# Patient Record
Sex: Female | Born: 1935 | Race: White | Hispanic: No | Smoking: Former smoker
Health system: Southern US, Community
[De-identification: ages and names within clinical notes are randomized; demographics above are authoritative.]

## PROBLEM LIST (undated history)

## (undated) DIAGNOSIS — K219 Gastro-esophageal reflux disease without esophagitis: Secondary | ICD-10-CM

## (undated) DIAGNOSIS — N3281 Overactive bladder: Secondary | ICD-10-CM

## (undated) DIAGNOSIS — E785 Hyperlipidemia, unspecified: Secondary | ICD-10-CM

## (undated) DIAGNOSIS — E119 Type 2 diabetes mellitus without complications: Secondary | ICD-10-CM

## (undated) DIAGNOSIS — F0281 Dementia in other diseases classified elsewhere with behavioral disturbance: Secondary | ICD-10-CM

## (undated) DIAGNOSIS — F02818 Dementia in other diseases classified elsewhere, unspecified severity, with other behavioral disturbance: Secondary | ICD-10-CM

## (undated) DIAGNOSIS — G309 Alzheimer's disease, unspecified: Secondary | ICD-10-CM

## (undated) DIAGNOSIS — M75100 Unspecified rotator cuff tear or rupture of unspecified shoulder, not specified as traumatic: Secondary | ICD-10-CM

## (undated) HISTORY — DX: Type 2 diabetes mellitus without complications: E11.9

## (undated) HISTORY — DX: Unspecified rotator cuff tear or rupture of unspecified shoulder, not specified as traumatic: M75.100

## (undated) HISTORY — DX: Dementia in other diseases classified elsewhere with behavioral disturbance: F02.81

## (undated) HISTORY — DX: Gastro-esophageal reflux disease without esophagitis: K21.9

## (undated) HISTORY — DX: Dementia in other diseases classified elsewhere, unspecified severity, with other behavioral disturbance: F02.818

## (undated) HISTORY — DX: Dementia in other diseases classified elsewhere with behavioral disturbance: G30.9

## (undated) HISTORY — DX: Hyperlipidemia, unspecified: E78.5

## (undated) HISTORY — DX: Overactive bladder: N32.81

---

## 1999-04-12 ENCOUNTER — Other Ambulatory Visit: Admission: RE | Admit: 1999-04-12 | Discharge: 1999-04-12 | Payer: Self-pay | Admitting: Obstetrics and Gynecology

## 2000-07-23 ENCOUNTER — Other Ambulatory Visit: Admission: RE | Admit: 2000-07-23 | Discharge: 2000-07-23 | Payer: Self-pay | Admitting: Obstetrics and Gynecology

## 2001-08-17 ENCOUNTER — Other Ambulatory Visit: Admission: RE | Admit: 2001-08-17 | Discharge: 2001-08-17 | Payer: Self-pay | Admitting: Obstetrics and Gynecology

## 2001-08-18 DIAGNOSIS — M75100 Unspecified rotator cuff tear or rupture of unspecified shoulder, not specified as traumatic: Secondary | ICD-10-CM

## 2001-08-18 HISTORY — DX: Unspecified rotator cuff tear or rupture of unspecified shoulder, not specified as traumatic: M75.100

## 2001-09-02 ENCOUNTER — Ambulatory Visit (HOSPITAL_COMMUNITY): Admission: RE | Admit: 2001-09-02 | Discharge: 2001-09-02 | Payer: Self-pay | Admitting: Gastroenterology

## 2002-06-22 ENCOUNTER — Ambulatory Visit (HOSPITAL_BASED_OUTPATIENT_CLINIC_OR_DEPARTMENT_OTHER): Admission: RE | Admit: 2002-06-22 | Discharge: 2002-06-23 | Payer: Self-pay | Admitting: Orthopedic Surgery

## 2006-07-22 ENCOUNTER — Ambulatory Visit (HOSPITAL_BASED_OUTPATIENT_CLINIC_OR_DEPARTMENT_OTHER): Admission: RE | Admit: 2006-07-22 | Discharge: 2006-07-22 | Payer: Self-pay | Admitting: Orthopedic Surgery

## 2009-09-15 ENCOUNTER — Inpatient Hospital Stay (HOSPITAL_COMMUNITY): Admission: EM | Admit: 2009-09-15 | Discharge: 2009-09-16 | Payer: Self-pay | Admitting: Emergency Medicine

## 2010-11-03 LAB — CBC
HCT: 39.5 % (ref 36.0–46.0)
Hemoglobin: 13.6 g/dL (ref 12.0–15.0)
MCHC: 34.3 g/dL (ref 30.0–36.0)
MCV: 90.2 fL (ref 78.0–100.0)
RDW: 13.1 % (ref 11.5–15.5)

## 2010-11-03 LAB — POCT I-STAT, CHEM 8
BUN: 18 mg/dL (ref 6–23)
Creatinine, Ser: 0.8 mg/dL (ref 0.4–1.2)
Glucose, Bld: 130 mg/dL — ABNORMAL HIGH (ref 70–99)
HCT: 37 % (ref 36.0–46.0)

## 2010-11-03 LAB — POCT CARDIAC MARKERS
CKMB, poc: <1 ng/mL — ABNORMAL LOW (ref 1.0–8.0)
Troponin i, poc: <0.05 ng/mL (ref 0.00–0.09)

## 2010-11-03 LAB — DIFFERENTIAL
Basophils Absolute: 0 10*3/uL (ref 0.0–0.1)
Basophils Relative: 0 % (ref 0–1)
Eosinophils Absolute: 0 10*3/uL (ref 0.0–0.7)
Lymphocytes Relative: 11 % — ABNORMAL LOW (ref 12–46)
Lymphs Abs: 0.8 10*3/uL (ref 0.7–4.0)
Monocytes Absolute: 0.3 10*3/uL (ref 0.1–1.0)
Neutrophils Relative %: 85 % — ABNORMAL HIGH (ref 43–77)

## 2010-11-03 LAB — BASIC METABOLIC PANEL
Chloride: 108 mEq/L (ref 96–112)
GFR calc Af Amer: >60 mL/min (ref >60)
GFR calc non Af Amer: >60 mL/min (ref >60)
Glucose, Bld: 109 mg/dL — ABNORMAL HIGH (ref 70–99)

## 2010-11-03 LAB — URINALYSIS, ROUTINE W REFLEX MICROSCOPIC
Glucose, UA: NEGATIVE mg/dL
Hgb urine dipstick: NEGATIVE
Ketones, ur: 15 mg/dL — AB
Specific Gravity, Urine: 1.019 (ref 1.005–1.030)

## 2010-11-03 LAB — GLUCOSE, CAPILLARY: Glucose-Capillary: 119 mg/dL — ABNORMAL HIGH (ref 70–99)

## 2010-11-27 IMAGING — CT CT HEAD W/O CM
1 of 2 series · 15 of 30 positions shown, 19 images · non-contrast
Comparison: None.

CLINICAL DATA: Dizziness with vomiting.  Confused and sleepy.
History of hypertension and diabetes.

CT HEAD WITHOUT CONTRAST
TECHNIQUE: Contiguous axial images were obtained from the base of
the skull through the vertex without contrast.

[Series 2: brain · axial · 0.47mm/px · z∈[-84,+34]mm · 15 of 36 slices shown, 19 images]
[im 3/36  brain]
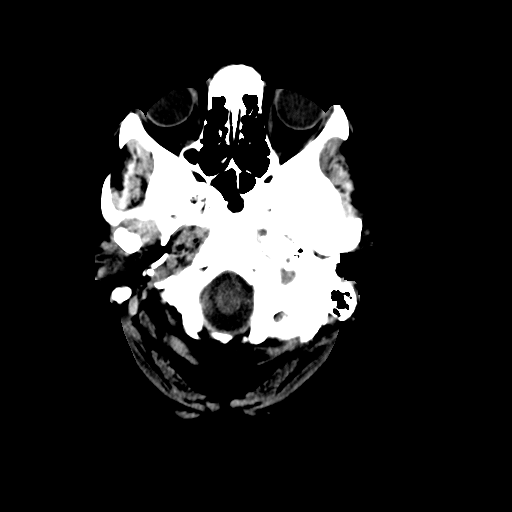
[im 3/36  bone]
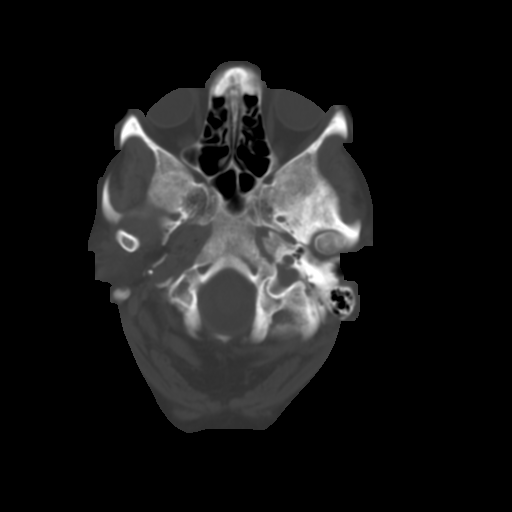
[im 5/36  brain]
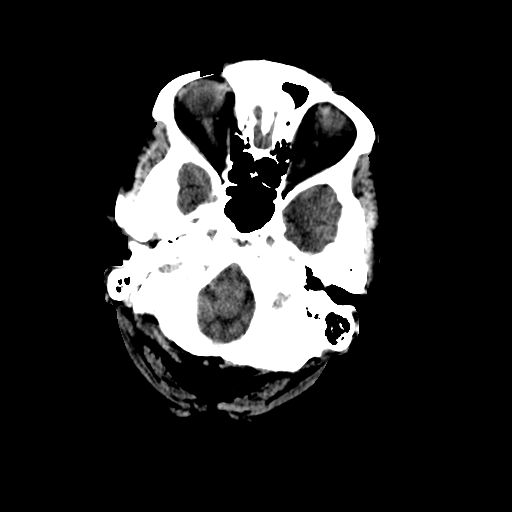
[im 7/36  brain]
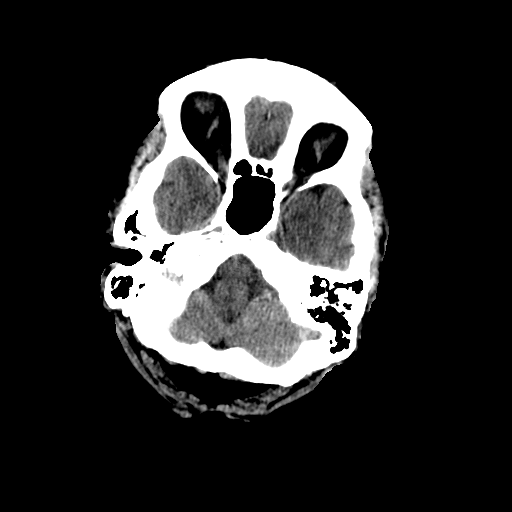
[im 9/36  brain]
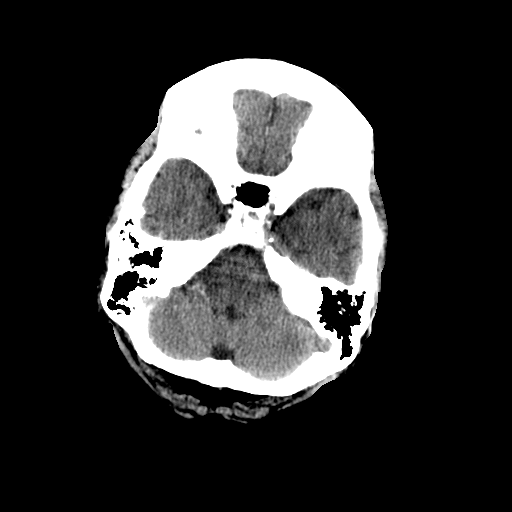
[im 11/36  brain]
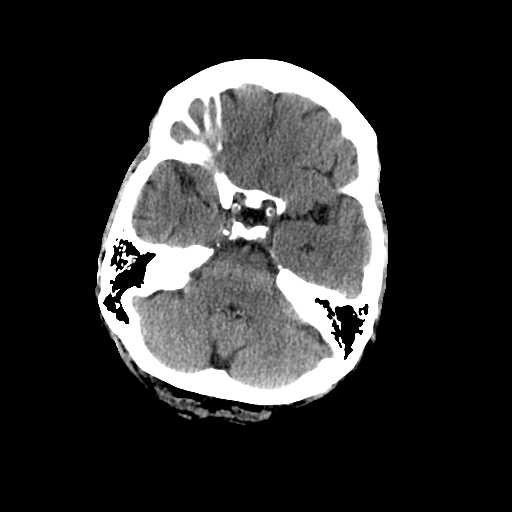
[im 11/36  bone]
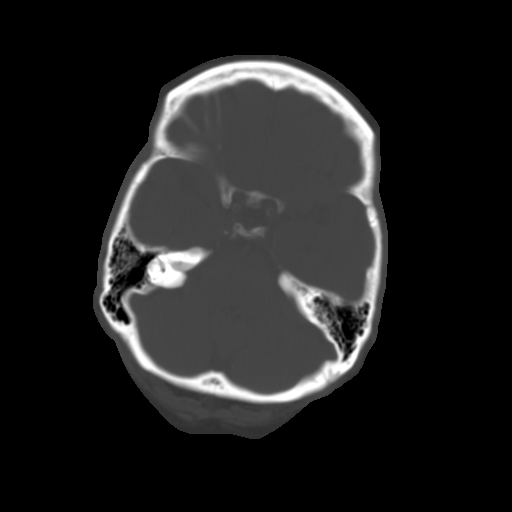
[im 14/36  brain]
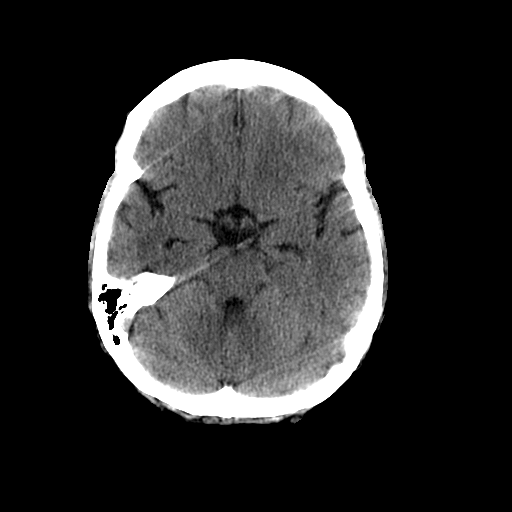
[im 16/36  brain]
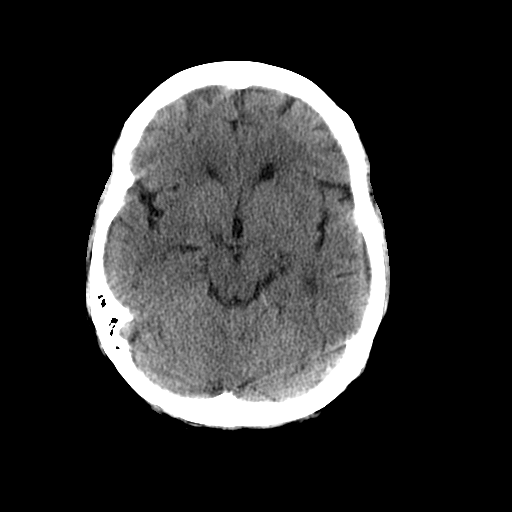
[im 18/36  brain]
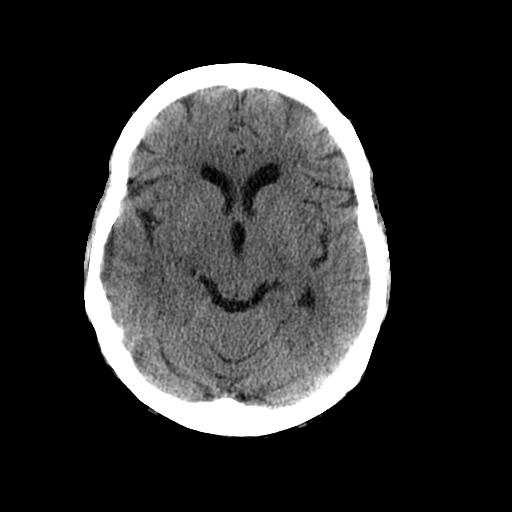
[im 20/36  brain]
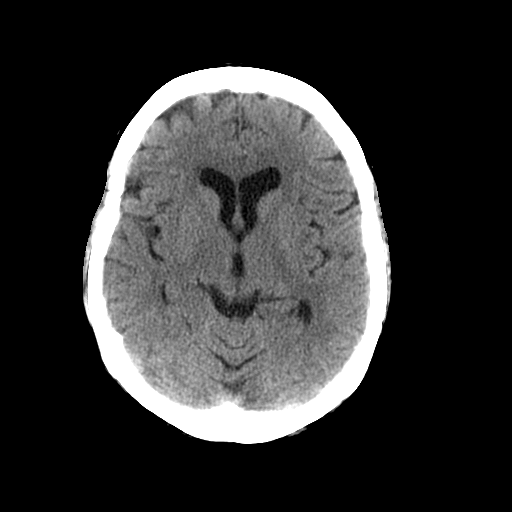
[im 20/36  bone]
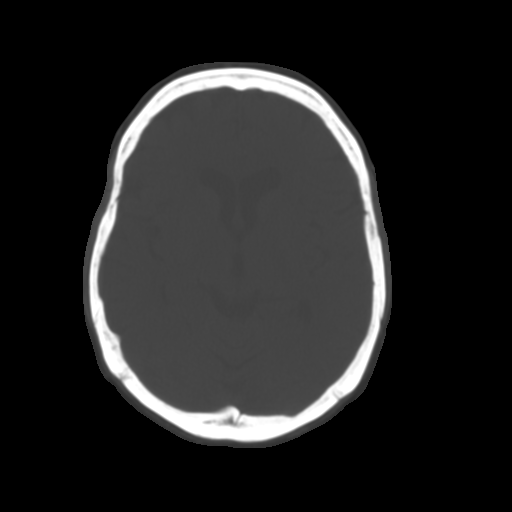
[im 22/36  brain]
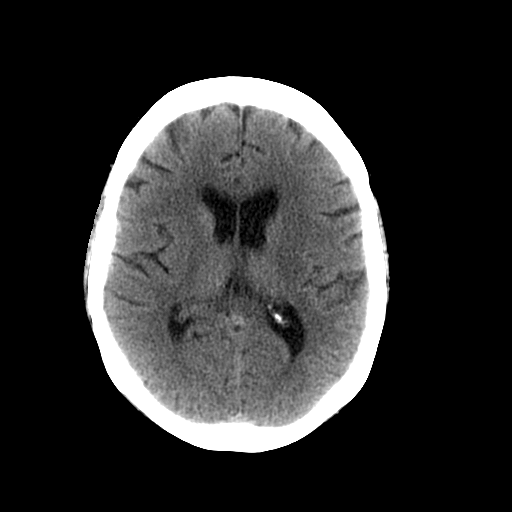
[im 25/36  brain]
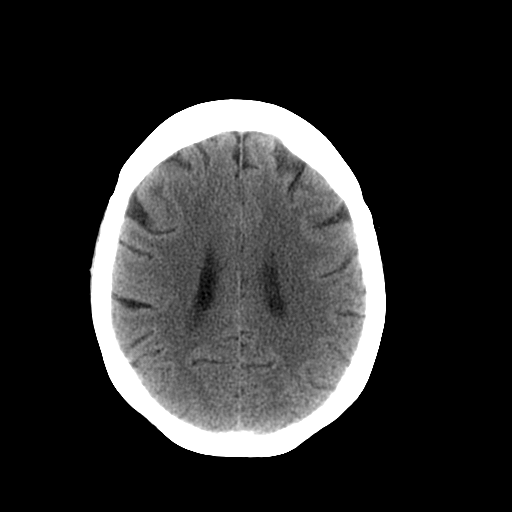
[im 27/36  brain]
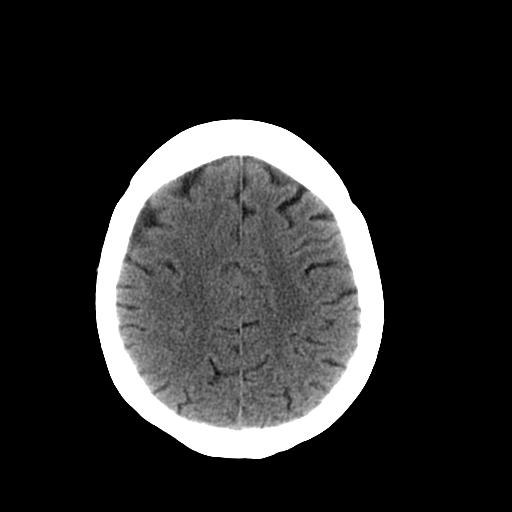
[im 29/36  brain]
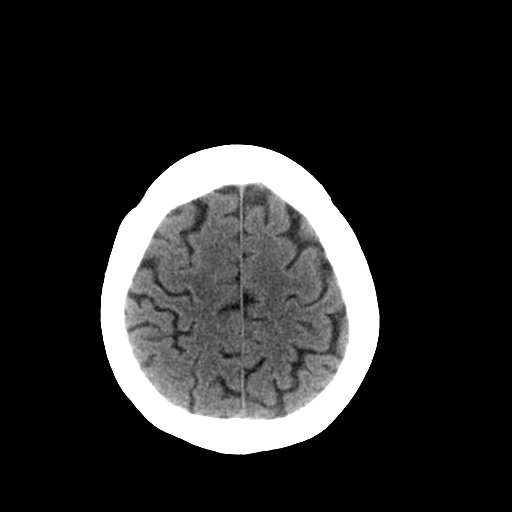
[im 29/36  bone]
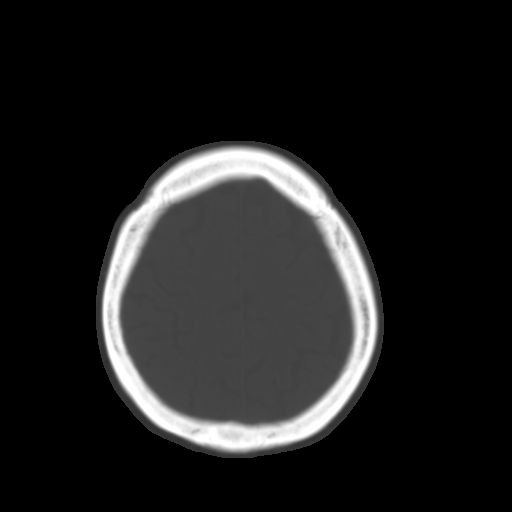
[im 31/36  brain]
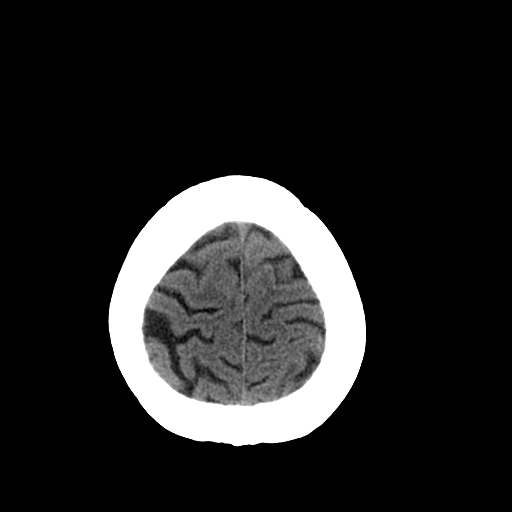
[im 33/36  brain]
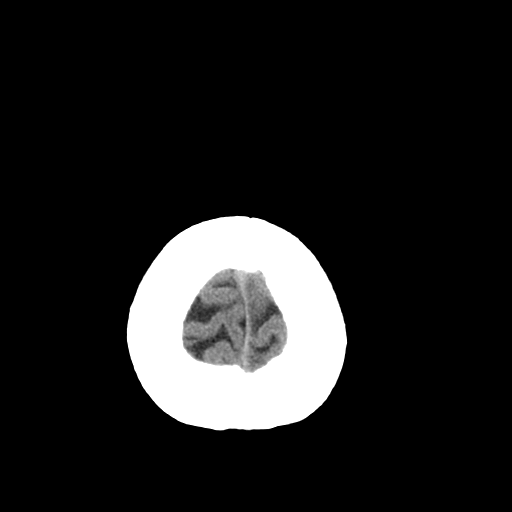

[15 of 30 positions shown; findings below may reference images not displayed]

FINDINGS: Mild motion degradation.  Some series necessitated
repeating.  Overall the study is diagnostic.

There is no evidence for acute infarction, intracranial hemorrhage,
mass lesion, hydrocephalus, or extra-axial fluid. There is mild age
appropriate atrophy.  Chronic microvascular ischemic change can be
seen in the periventricular and subcortical white matter.
Calvarium is intact.  Sinuses and mastoids are clear. Mild vascular
calcification is noted.
IMPRESSION: Mild atrophy and small vessel disease.  No acute intracranial
findings.

## 2011-01-03 NOTE — Op Note (Signed)
NAME:  Nichole Diaz, Nichole Diaz                            ACCOUNT NO.:  [id number]   MEDICAL RECORD NO.:  [id number]                   PATIENT TYPE:  AMB   LOCATION:  DSC                                  FACILITY:  [facility]   PHYSICIAN:  Harvie Junior, M.D.                DATE OF BIRTH:  1935-03-20   DATE OF PROCEDURE:  06/22/2002  DATE OF DISCHARGE:                                 OPERATIVE REPORT   PREOPERATIVE DIAGNOSES:  Impingement and acromioclavicular joint arthritis,  suspected rotator cuff tear.   POSTOPERATIVE DIAGNOSES:  1. Impingement and acromioclavicular joint arthritis.  2. Suspected rotator cuff tear.  3. Rotator cuff tear.  4. Superior labral tear with bicipital inflammation.   OPERATION PERFORMED:  1. Mini open rotator cuff repair with acromioplasty.  2. Arthroscopic distal clavicle excision.  3. Arthroscopic debridement of bicipital tendinitis and superior labral     tear.   SURGEON:  Harvie Junior, M.D.   ASSISTANT:  None.   ANESTHESIA:  General with interscalene block.   INDICATIONS FOR PROCEDURE:  The patient is a 76 year old female with a long  history of having shoulder pain.  We followed her conservatively for many  months.  Ultimately, injection therapy helped but her pain recurred.  Weakness ensued.  Because of continued complaints of pain, MRI was obtained  with showed significant inflammation of the rotator cuff.  My reading of her  MRI I thought showed a full thickness rotator cuff tear.  Ultimately, she  was taken to the operating room because of pain and weakness and failure of  conservative care.   DESCRIPTION OF PROCEDURE:  The patient was taken to the operating room where  after adequate anesthesia was obtained with interscalene block and general,  the patient was placed supine on the operating table and moved to the beach  chair position.  All bony prominences were well padded.  Attention was  turned to the right shoulder where after routine  prepping and draping,  arthroscopic examination of the shoulder showed that there was an obvious  rotator cuff tear.  The glenohumeral joint was inspected and noted to have  some superior labral fraying and tearing as well as inflammation at the  insertion of the biceps tendon into the glenoid.  At this point, the biceps  tendon was debrided at the superior attachment of the glenoid as well as the  superior labral tear.  At this time attention was turned into the  glenohumeral joint to look laterally where the rotator cuff tear was  identified.  At this point attention was turned out of the glenohumeral  joint and up into the subacromial space.  The subacromial space showed that  there was a large anterolateral spur and anterolateral acromioplasty was  performed.  The clavicle was impinging on the rotator cuff as well and a  distal clavicle excision was performed for a  span of 15 mm.  Following this,  attention was turned laterally where the rotator cuff was clearly  identified.  It was grasped with the grasper and could easily be moved over  to its point of origin.  At this point the arthroscopic portion of the case  was stopped and a mini lateral incision was made over the shoulder,  subcutaneous tissues were dissected down to the level of the deltoid muscle.  It was divided in line with the raphe.  Anteriorly the rotator cuff was  identified and bursectomy was performed.  The rotator cuff was then  reattached with four suture anchors going from the biceps tendon posteriorly  back past the infraspinatus to the teres minor.  At this point the shoulder  was copiously irrigated and suctioned dry.  The arm was put through a range  of motion.  Excellent stability was achieved and the rotator cuff appeared  to be under no tension.  At this point the interval in the deltoid muscle  was closed with one Vicryl running suture, the skin was then closed with 0  and 2-0 Vicryl and a 3-0 Maxon running  suture.  Benzoin and Steri-Strips  were applied.  A sterile compressive dressing was applied and the patient  was taken to the recovery room where she was noted to be in satisfactory  condition.  The estimated blood loss for this procedure was minimal.                                                 Harvie Junior, M.D.    Ranae Plumber  D:  06/22/2002  T:  06/22/2002  Job:  [id number]

## 2011-01-03 NOTE — Op Note (Signed)
NAME:  Nichole Diaz, Nichole Diaz                  ACCOUNT NO.:  [id number]   MEDICAL RECORD NO.:  [id number]          PATIENT TYPE:  AMB   LOCATION:  DSC                          FACILITY:  [facility]   PHYSICIAN:  Harvie Junior, M.D.   DATE OF BIRTH:  October 11, 1934   DATE OF PROCEDURE:  07/22/2006  DATE OF DISCHARGE:                               OPERATIVE REPORT   PREOPERATIVE DIAGNOSIS:  Trigger finger, right ring.   POSTOPERATIVE DIAGNOSIS:  Trigger finger, right ring.   PRINCIPLE PROCEDURE:  Right trigger finger release.   SURGEON:  Harvie Junior, M.D.   ASSISTANT:  Marshia Ly, P.A.   ANESTHESIA:  Forearm-based IV regional.   BRIEF HISTORY:  This is a 76 year old female with a long history of  having had triggering in her right ring finger.  We treated her  conservatively with injection therapy and anti-inflammatory medication.  Because of failure of conservative care, she is ultimately taken to the  operating room for a right trigger finger release.   PROCEDURE:  The patient was taken to the operating room, and after  adequate anesthesia was obtained with a forearm-based IV regional on the  floor, the patient was taken to the operating room, where the right arm  was prepped and draped in the usual sterile fashion.  Following this, a  blood pressure tourniquet, which had previously been inflated prior to  the block, was exploited, and a curved incision was made just over the  A1 pulley of the ring finger.  Subcutaneous tissues were dissected down  to the level of the tendon and the tendon was clearly identified.  The  A1 pulley was identified and divided.  The tendon could then be pulled  into the wound easily.  The finger then moved without any problems and  no evidence of triggering.  At this point, the wound was copiously  irrigated and suctioned dry, closed with interrupted sutures.  A sterile  compressive dressing was applied. The patient was taken to recovery,  where she was noted  to be in satisfactory condition.  The estimated  blood loss for the procedure was negligible.      Harvie Junior, M.D.  Electronically Signed     JLG/MEDQ  D:  07/22/2006  T:  07/23/2006  Job:  [id number]

## 2011-09-08 DIAGNOSIS — Z01419 Encounter for gynecological examination (general) (routine) without abnormal findings: Secondary | ICD-10-CM | POA: Diagnosis not present

## 2011-09-16 DIAGNOSIS — L570 Actinic keratosis: Secondary | ICD-10-CM | POA: Diagnosis not present

## 2011-10-23 DIAGNOSIS — Z1231 Encounter for screening mammogram for malignant neoplasm of breast: Secondary | ICD-10-CM | POA: Diagnosis not present

## 2011-10-30 DIAGNOSIS — M76899 Other specified enthesopathies of unspecified lower limb, excluding foot: Secondary | ICD-10-CM | POA: Diagnosis not present

## 2011-10-30 DIAGNOSIS — M25559 Pain in unspecified hip: Secondary | ICD-10-CM | POA: Diagnosis not present

## 2011-11-05 DIAGNOSIS — M76899 Other specified enthesopathies of unspecified lower limb, excluding foot: Secondary | ICD-10-CM | POA: Diagnosis not present

## 2011-11-05 DIAGNOSIS — M25559 Pain in unspecified hip: Secondary | ICD-10-CM | POA: Diagnosis not present

## 2011-11-10 DIAGNOSIS — M76899 Other specified enthesopathies of unspecified lower limb, excluding foot: Secondary | ICD-10-CM | POA: Diagnosis not present

## 2011-11-10 DIAGNOSIS — M25559 Pain in unspecified hip: Secondary | ICD-10-CM | POA: Diagnosis not present

## 2011-11-12 DIAGNOSIS — M76899 Other specified enthesopathies of unspecified lower limb, excluding foot: Secondary | ICD-10-CM | POA: Diagnosis not present

## 2011-11-12 DIAGNOSIS — M25559 Pain in unspecified hip: Secondary | ICD-10-CM | POA: Diagnosis not present

## 2011-11-17 DIAGNOSIS — M25559 Pain in unspecified hip: Secondary | ICD-10-CM | POA: Diagnosis not present

## 2011-11-17 DIAGNOSIS — M76899 Other specified enthesopathies of unspecified lower limb, excluding foot: Secondary | ICD-10-CM | POA: Diagnosis not present

## 2011-11-19 DIAGNOSIS — M76899 Other specified enthesopathies of unspecified lower limb, excluding foot: Secondary | ICD-10-CM | POA: Diagnosis not present

## 2011-11-19 DIAGNOSIS — M25559 Pain in unspecified hip: Secondary | ICD-10-CM | POA: Diagnosis not present

## 2011-11-24 DIAGNOSIS — M25559 Pain in unspecified hip: Secondary | ICD-10-CM | POA: Diagnosis not present

## 2011-11-24 DIAGNOSIS — M76899 Other specified enthesopathies of unspecified lower limb, excluding foot: Secondary | ICD-10-CM | POA: Diagnosis not present

## 2011-12-09 DIAGNOSIS — M76899 Other specified enthesopathies of unspecified lower limb, excluding foot: Secondary | ICD-10-CM | POA: Diagnosis not present

## 2011-12-09 DIAGNOSIS — M25559 Pain in unspecified hip: Secondary | ICD-10-CM | POA: Diagnosis not present

## 2011-12-11 DIAGNOSIS — K645 Perianal venous thrombosis: Secondary | ICD-10-CM | POA: Diagnosis not present

## 2011-12-11 DIAGNOSIS — K648 Other hemorrhoids: Secondary | ICD-10-CM | POA: Diagnosis not present

## 2011-12-11 DIAGNOSIS — K5730 Diverticulosis of large intestine without perforation or abscess without bleeding: Secondary | ICD-10-CM | POA: Diagnosis not present

## 2011-12-11 DIAGNOSIS — Z86010 Personal history of colonic polyps: Secondary | ICD-10-CM | POA: Diagnosis not present

## 2011-12-11 DIAGNOSIS — Z1211 Encounter for screening for malignant neoplasm of colon: Secondary | ICD-10-CM | POA: Diagnosis not present

## 2011-12-11 HISTORY — PX: COLONOSCOPY: SHX174

## 2011-12-22 DIAGNOSIS — E785 Hyperlipidemia, unspecified: Secondary | ICD-10-CM | POA: Diagnosis not present

## 2011-12-22 DIAGNOSIS — Z79899 Other long term (current) drug therapy: Secondary | ICD-10-CM | POA: Diagnosis not present

## 2011-12-22 DIAGNOSIS — E1139 Type 2 diabetes mellitus with other diabetic ophthalmic complication: Secondary | ICD-10-CM | POA: Diagnosis not present

## 2011-12-22 DIAGNOSIS — I10 Essential (primary) hypertension: Secondary | ICD-10-CM | POA: Diagnosis not present

## 2012-02-11 DIAGNOSIS — K648 Other hemorrhoids: Secondary | ICD-10-CM | POA: Diagnosis not present

## 2012-02-12 DIAGNOSIS — D235 Other benign neoplasm of skin of trunk: Secondary | ICD-10-CM | POA: Diagnosis not present

## 2012-02-12 DIAGNOSIS — D485 Neoplasm of uncertain behavior of skin: Secondary | ICD-10-CM | POA: Diagnosis not present

## 2012-02-25 DIAGNOSIS — K648 Other hemorrhoids: Secondary | ICD-10-CM | POA: Diagnosis not present

## 2012-03-10 DIAGNOSIS — K648 Other hemorrhoids: Secondary | ICD-10-CM | POA: Diagnosis not present

## 2012-04-21 DIAGNOSIS — I10 Essential (primary) hypertension: Secondary | ICD-10-CM | POA: Diagnosis not present

## 2012-04-21 DIAGNOSIS — E119 Type 2 diabetes mellitus without complications: Secondary | ICD-10-CM | POA: Diagnosis not present

## 2012-04-21 DIAGNOSIS — R82998 Other abnormal findings in urine: Secondary | ICD-10-CM | POA: Diagnosis not present

## 2012-04-21 DIAGNOSIS — Z780 Asymptomatic menopausal state: Secondary | ICD-10-CM | POA: Diagnosis not present

## 2012-04-21 DIAGNOSIS — E785 Hyperlipidemia, unspecified: Secondary | ICD-10-CM | POA: Diagnosis not present

## 2012-04-28 DIAGNOSIS — E1139 Type 2 diabetes mellitus with other diabetic ophthalmic complication: Secondary | ICD-10-CM | POA: Diagnosis not present

## 2012-04-28 DIAGNOSIS — Z23 Encounter for immunization: Secondary | ICD-10-CM | POA: Diagnosis not present

## 2012-04-28 DIAGNOSIS — R350 Frequency of micturition: Secondary | ICD-10-CM | POA: Diagnosis not present

## 2012-04-28 DIAGNOSIS — I10 Essential (primary) hypertension: Secondary | ICD-10-CM | POA: Diagnosis not present

## 2012-04-28 DIAGNOSIS — E785 Hyperlipidemia, unspecified: Secondary | ICD-10-CM | POA: Diagnosis not present

## 2012-04-28 DIAGNOSIS — Z0000 Encounter for general adult medical examination without abnormal findings: Secondary | ICD-10-CM | POA: Diagnosis not present

## 2012-04-29 DIAGNOSIS — Z1212 Encounter for screening for malignant neoplasm of rectum: Secondary | ICD-10-CM | POA: Diagnosis not present

## 2012-07-28 DIAGNOSIS — K6289 Other specified diseases of anus and rectum: Secondary | ICD-10-CM | POA: Diagnosis not present

## 2012-08-06 DIAGNOSIS — H524 Presbyopia: Secondary | ICD-10-CM | POA: Diagnosis not present

## 2012-08-06 DIAGNOSIS — E119 Type 2 diabetes mellitus without complications: Secondary | ICD-10-CM | POA: Diagnosis not present

## 2012-08-06 DIAGNOSIS — Z961 Presence of intraocular lens: Secondary | ICD-10-CM | POA: Diagnosis not present

## 2012-08-06 DIAGNOSIS — H52209 Unspecified astigmatism, unspecified eye: Secondary | ICD-10-CM | POA: Diagnosis not present

## 2012-10-21 DIAGNOSIS — K219 Gastro-esophageal reflux disease without esophagitis: Secondary | ICD-10-CM | POA: Diagnosis not present

## 2012-10-21 DIAGNOSIS — Z79899 Other long term (current) drug therapy: Secondary | ICD-10-CM | POA: Diagnosis not present

## 2012-10-21 DIAGNOSIS — E1139 Type 2 diabetes mellitus with other diabetic ophthalmic complication: Secondary | ICD-10-CM | POA: Diagnosis not present

## 2013-02-07 DIAGNOSIS — L259 Unspecified contact dermatitis, unspecified cause: Secondary | ICD-10-CM | POA: Diagnosis not present

## 2013-02-07 DIAGNOSIS — M7130 Other bursal cyst, unspecified site: Secondary | ICD-10-CM | POA: Diagnosis not present

## 2013-02-07 DIAGNOSIS — D485 Neoplasm of uncertain behavior of skin: Secondary | ICD-10-CM | POA: Diagnosis not present

## 2013-04-28 DIAGNOSIS — Z1231 Encounter for screening mammogram for malignant neoplasm of breast: Secondary | ICD-10-CM | POA: Diagnosis not present

## 2013-04-29 DIAGNOSIS — R809 Proteinuria, unspecified: Secondary | ICD-10-CM | POA: Diagnosis not present

## 2013-04-29 DIAGNOSIS — E785 Hyperlipidemia, unspecified: Secondary | ICD-10-CM | POA: Diagnosis not present

## 2013-04-29 DIAGNOSIS — I10 Essential (primary) hypertension: Secondary | ICD-10-CM | POA: Diagnosis not present

## 2013-04-29 DIAGNOSIS — E1139 Type 2 diabetes mellitus with other diabetic ophthalmic complication: Secondary | ICD-10-CM | POA: Diagnosis not present

## 2013-04-29 DIAGNOSIS — R82998 Other abnormal findings in urine: Secondary | ICD-10-CM | POA: Diagnosis not present

## 2013-05-05 DIAGNOSIS — F341 Dysthymic disorder: Secondary | ICD-10-CM | POA: Diagnosis not present

## 2013-05-05 DIAGNOSIS — E669 Obesity, unspecified: Secondary | ICD-10-CM | POA: Diagnosis not present

## 2013-05-05 DIAGNOSIS — I10 Essential (primary) hypertension: Secondary | ICD-10-CM | POA: Diagnosis not present

## 2013-05-05 DIAGNOSIS — K219 Gastro-esophageal reflux disease without esophagitis: Secondary | ICD-10-CM | POA: Diagnosis not present

## 2013-05-05 DIAGNOSIS — Z0000 Encounter for general adult medical examination without abnormal findings: Secondary | ICD-10-CM | POA: Diagnosis not present

## 2013-05-05 DIAGNOSIS — Z79899 Other long term (current) drug therapy: Secondary | ICD-10-CM | POA: Diagnosis not present

## 2013-05-05 DIAGNOSIS — E785 Hyperlipidemia, unspecified: Secondary | ICD-10-CM | POA: Diagnosis not present

## 2013-05-05 DIAGNOSIS — E1139 Type 2 diabetes mellitus with other diabetic ophthalmic complication: Secondary | ICD-10-CM | POA: Diagnosis not present

## 2013-05-05 DIAGNOSIS — Z23 Encounter for immunization: Secondary | ICD-10-CM | POA: Diagnosis not present

## 2013-08-19 DIAGNOSIS — Z6830 Body mass index (BMI) 30.0-30.9, adult: Secondary | ICD-10-CM | POA: Diagnosis not present

## 2013-08-19 DIAGNOSIS — R197 Diarrhea, unspecified: Secondary | ICD-10-CM | POA: Diagnosis not present

## 2013-08-19 DIAGNOSIS — R05 Cough: Secondary | ICD-10-CM | POA: Diagnosis not present

## 2013-08-19 DIAGNOSIS — J069 Acute upper respiratory infection, unspecified: Secondary | ICD-10-CM | POA: Diagnosis not present

## 2013-08-19 DIAGNOSIS — R059 Cough, unspecified: Secondary | ICD-10-CM | POA: Diagnosis not present

## 2013-09-08 DIAGNOSIS — E119 Type 2 diabetes mellitus without complications: Secondary | ICD-10-CM | POA: Diagnosis not present

## 2013-09-08 DIAGNOSIS — H35379 Puckering of macula, unspecified eye: Secondary | ICD-10-CM | POA: Diagnosis not present

## 2013-09-08 DIAGNOSIS — Z961 Presence of intraocular lens: Secondary | ICD-10-CM | POA: Diagnosis not present

## 2013-11-03 DIAGNOSIS — E1139 Type 2 diabetes mellitus with other diabetic ophthalmic complication: Secondary | ICD-10-CM | POA: Diagnosis not present

## 2013-11-03 DIAGNOSIS — F341 Dysthymic disorder: Secondary | ICD-10-CM | POA: Diagnosis not present

## 2013-11-03 DIAGNOSIS — E669 Obesity, unspecified: Secondary | ICD-10-CM | POA: Diagnosis not present

## 2013-11-03 DIAGNOSIS — Z1331 Encounter for screening for depression: Secondary | ICD-10-CM | POA: Diagnosis not present

## 2013-11-03 DIAGNOSIS — E785 Hyperlipidemia, unspecified: Secondary | ICD-10-CM | POA: Diagnosis not present

## 2013-11-03 DIAGNOSIS — Z79899 Other long term (current) drug therapy: Secondary | ICD-10-CM | POA: Diagnosis not present

## 2013-11-03 DIAGNOSIS — I10 Essential (primary) hypertension: Secondary | ICD-10-CM | POA: Diagnosis not present

## 2013-11-03 DIAGNOSIS — N318 Other neuromuscular dysfunction of bladder: Secondary | ICD-10-CM | POA: Diagnosis not present

## 2013-11-08 DIAGNOSIS — C44519 Basal cell carcinoma of skin of other part of trunk: Secondary | ICD-10-CM | POA: Diagnosis not present

## 2014-01-10 DIAGNOSIS — Z85828 Personal history of other malignant neoplasm of skin: Secondary | ICD-10-CM | POA: Diagnosis not present

## 2014-03-30 DIAGNOSIS — Z85828 Personal history of other malignant neoplasm of skin: Secondary | ICD-10-CM | POA: Diagnosis not present

## 2014-03-30 DIAGNOSIS — L570 Actinic keratosis: Secondary | ICD-10-CM | POA: Diagnosis not present

## 2014-05-11 DIAGNOSIS — E1139 Type 2 diabetes mellitus with other diabetic ophthalmic complication: Secondary | ICD-10-CM | POA: Diagnosis not present

## 2014-05-11 DIAGNOSIS — E785 Hyperlipidemia, unspecified: Secondary | ICD-10-CM | POA: Diagnosis not present

## 2014-05-11 DIAGNOSIS — I10 Essential (primary) hypertension: Secondary | ICD-10-CM | POA: Diagnosis not present

## 2014-05-18 DIAGNOSIS — I10 Essential (primary) hypertension: Secondary | ICD-10-CM | POA: Diagnosis not present

## 2014-05-18 DIAGNOSIS — R42 Dizziness and giddiness: Secondary | ICD-10-CM | POA: Diagnosis not present

## 2014-05-18 DIAGNOSIS — Z23 Encounter for immunization: Secondary | ICD-10-CM | POA: Diagnosis not present

## 2014-05-18 DIAGNOSIS — Z1389 Encounter for screening for other disorder: Secondary | ICD-10-CM | POA: Diagnosis not present

## 2014-05-18 DIAGNOSIS — Z008 Encounter for other general examination: Secondary | ICD-10-CM | POA: Diagnosis not present

## 2014-05-18 DIAGNOSIS — F418 Other specified anxiety disorders: Secondary | ICD-10-CM | POA: Diagnosis not present

## 2014-05-18 DIAGNOSIS — E1039 Type 1 diabetes mellitus with other diabetic ophthalmic complication: Secondary | ICD-10-CM | POA: Diagnosis not present

## 2014-05-18 DIAGNOSIS — E785 Hyperlipidemia, unspecified: Secondary | ICD-10-CM | POA: Diagnosis not present

## 2014-05-18 DIAGNOSIS — N3281 Overactive bladder: Secondary | ICD-10-CM | POA: Diagnosis not present

## 2014-05-18 DIAGNOSIS — K219 Gastro-esophageal reflux disease without esophagitis: Secondary | ICD-10-CM | POA: Diagnosis not present

## 2014-06-02 DIAGNOSIS — Z1231 Encounter for screening mammogram for malignant neoplasm of breast: Secondary | ICD-10-CM | POA: Diagnosis not present

## 2014-06-02 HISTORY — PX: DIAGNOSTIC MAMMOGRAM: HXRAD719

## 2014-06-29 DIAGNOSIS — Z08 Encounter for follow-up examination after completed treatment for malignant neoplasm: Secondary | ICD-10-CM | POA: Diagnosis not present

## 2014-06-29 DIAGNOSIS — Z85828 Personal history of other malignant neoplasm of skin: Secondary | ICD-10-CM | POA: Diagnosis not present

## 2014-06-29 DIAGNOSIS — L570 Actinic keratosis: Secondary | ICD-10-CM | POA: Diagnosis not present

## 2014-11-07 DIAGNOSIS — R5381 Other malaise: Secondary | ICD-10-CM | POA: Diagnosis not present

## 2014-11-07 DIAGNOSIS — E559 Vitamin D deficiency, unspecified: Secondary | ICD-10-CM | POA: Diagnosis not present

## 2014-11-07 DIAGNOSIS — Z6830 Body mass index (BMI) 30.0-30.9, adult: Secondary | ICD-10-CM | POA: Diagnosis not present

## 2014-11-07 DIAGNOSIS — R946 Abnormal results of thyroid function studies: Secondary | ICD-10-CM | POA: Diagnosis not present

## 2014-11-07 DIAGNOSIS — R6889 Other general symptoms and signs: Secondary | ICD-10-CM | POA: Diagnosis not present

## 2014-11-09 DIAGNOSIS — Z961 Presence of intraocular lens: Secondary | ICD-10-CM | POA: Diagnosis not present

## 2014-11-09 DIAGNOSIS — H35372 Puckering of macula, left eye: Secondary | ICD-10-CM | POA: Diagnosis not present

## 2014-11-09 DIAGNOSIS — E119 Type 2 diabetes mellitus without complications: Secondary | ICD-10-CM | POA: Diagnosis not present

## 2014-11-16 DIAGNOSIS — K219 Gastro-esophageal reflux disease without esophagitis: Secondary | ICD-10-CM | POA: Diagnosis not present

## 2014-11-16 DIAGNOSIS — N3281 Overactive bladder: Secondary | ICD-10-CM | POA: Diagnosis not present

## 2014-11-16 DIAGNOSIS — E669 Obesity, unspecified: Secondary | ICD-10-CM | POA: Diagnosis not present

## 2014-11-16 DIAGNOSIS — E11319 Type 2 diabetes mellitus with unspecified diabetic retinopathy without macular edema: Secondary | ICD-10-CM | POA: Diagnosis not present

## 2014-11-16 DIAGNOSIS — I10 Essential (primary) hypertension: Secondary | ICD-10-CM | POA: Diagnosis not present

## 2014-11-16 DIAGNOSIS — Z6830 Body mass index (BMI) 30.0-30.9, adult: Secondary | ICD-10-CM | POA: Diagnosis not present

## 2014-11-16 DIAGNOSIS — E785 Hyperlipidemia, unspecified: Secondary | ICD-10-CM | POA: Diagnosis not present

## 2014-11-16 DIAGNOSIS — Z1389 Encounter for screening for other disorder: Secondary | ICD-10-CM | POA: Diagnosis not present

## 2015-08-20 DIAGNOSIS — Z23 Encounter for immunization: Secondary | ICD-10-CM | POA: Diagnosis not present

## 2015-10-11 DIAGNOSIS — L821 Other seborrheic keratosis: Secondary | ICD-10-CM | POA: Diagnosis not present

## 2015-10-11 DIAGNOSIS — L812 Freckles: Secondary | ICD-10-CM | POA: Diagnosis not present

## 2015-11-15 DIAGNOSIS — E119 Type 2 diabetes mellitus without complications: Secondary | ICD-10-CM | POA: Diagnosis not present

## 2015-11-15 DIAGNOSIS — Z0100 Encounter for examination of eyes and vision without abnormal findings: Secondary | ICD-10-CM | POA: Diagnosis not present

## 2015-11-15 DIAGNOSIS — H35372 Puckering of macula, left eye: Secondary | ICD-10-CM | POA: Diagnosis not present

## 2015-11-15 DIAGNOSIS — Z961 Presence of intraocular lens: Secondary | ICD-10-CM | POA: Diagnosis not present

## 2015-12-05 DIAGNOSIS — E7800 Pure hypercholesterolemia, unspecified: Secondary | ICD-10-CM | POA: Diagnosis not present

## 2015-12-05 DIAGNOSIS — I10 Essential (primary) hypertension: Secondary | ICD-10-CM | POA: Diagnosis not present

## 2015-12-05 DIAGNOSIS — Z6830 Body mass index (BMI) 30.0-30.9, adult: Secondary | ICD-10-CM | POA: Diagnosis not present

## 2015-12-05 DIAGNOSIS — N3281 Overactive bladder: Secondary | ICD-10-CM | POA: Diagnosis not present

## 2015-12-05 DIAGNOSIS — E559 Vitamin D deficiency, unspecified: Secondary | ICD-10-CM | POA: Diagnosis not present

## 2015-12-05 DIAGNOSIS — E668 Other obesity: Secondary | ICD-10-CM | POA: Diagnosis not present

## 2015-12-05 DIAGNOSIS — K219 Gastro-esophageal reflux disease without esophagitis: Secondary | ICD-10-CM | POA: Diagnosis not present

## 2015-12-05 DIAGNOSIS — R946 Abnormal results of thyroid function studies: Secondary | ICD-10-CM | POA: Diagnosis not present

## 2015-12-05 DIAGNOSIS — R42 Dizziness and giddiness: Secondary | ICD-10-CM | POA: Diagnosis not present

## 2015-12-05 DIAGNOSIS — E11319 Type 2 diabetes mellitus with unspecified diabetic retinopathy without macular edema: Secondary | ICD-10-CM | POA: Diagnosis not present

## 2015-12-05 DIAGNOSIS — R5381 Other malaise: Secondary | ICD-10-CM | POA: Diagnosis not present

## 2016-02-25 DIAGNOSIS — R413 Other amnesia: Secondary | ICD-10-CM | POA: Diagnosis not present

## 2016-02-25 DIAGNOSIS — Z6830 Body mass index (BMI) 30.0-30.9, adult: Secondary | ICD-10-CM | POA: Diagnosis not present

## 2016-03-14 ENCOUNTER — Ambulatory Visit (INDEPENDENT_AMBULATORY_CARE_PROVIDER_SITE_OTHER): Payer: Medicare Other | Admitting: Neurology

## 2016-03-14 ENCOUNTER — Encounter: Payer: Self-pay | Admitting: Neurology

## 2016-03-14 VITALS — BP 158/65 | HR 72 | Ht 62.0 in | Wt 173.2 lb

## 2016-03-14 DIAGNOSIS — G3184 Mild cognitive impairment, so stated: Secondary | ICD-10-CM

## 2016-03-14 DIAGNOSIS — R410 Disorientation, unspecified: Secondary | ICD-10-CM

## 2016-03-14 DIAGNOSIS — F05 Delirium due to known physiological condition: Secondary | ICD-10-CM

## 2016-03-14 DIAGNOSIS — R413 Other amnesia: Secondary | ICD-10-CM

## 2016-03-14 NOTE — Patient Instructions (Signed)
Remember to drink plenty of fluid, eat healthy meals and do not skip any meals. Try to eat protein with a every meal and eat a healthy snack such as fruit or nuts in between meals. Try to keep a regular sleep-wake schedule and try to exercise daily, particularly in the form of walking, 20-30 minutes a day, if you can.   As far as your medications are concerned, I would like to suggest: If you do not join the clinical trial, would consider starting Aricept (Donepezil)  As far as diagnostic testing:  If you do not join the clinical trial, would consider MRI of the brain and formal neurocognitive testing  I would like to see you back in 4-6 months unless you join the clinical trial, sooner if we need to. Please call us with any interim questions, concerns, problems, updates or refill requests.    Our phone number is [phone]. We also have an after hours call service for urgent matters and there is a physician on-call for urgent questions. For any emergencies you know to call 911 or go to the nearest emergency room  Donepezil tablets What is this medicine? DONEPEZIL (doe NEP e zil) is used to treat mild to moderate dementia caused by Alzheimer's disease. This medicine may be used for other purposes; ask your health care provider or pharmacist if you have questions. What should I tell my health care provider before I take this medicine? They need to know if you have any of these conditions: -asthma or other lung disease -difficulty passing urine -head injury -heart disease -history of irregular heartbeat -liver disease -seizures (convulsions) -stomach or intestinal disease, ulcers or stomach bleeding -an unusual or allergic reaction to donepezil, other medicines, foods, dyes, or preservatives -pregnant or trying to get pregnant -breast-feeding How should I use this medicine? Take this medicine by mouth with a glass of water. Follow the directions on the prescription label. You may take  this medicine with or without food. Take this medicine at regular intervals. This medicine is usually taken before bedtime. Do not take it more often than directed. Continue to take your medicine even if you feel better. Do not stop taking except on your doctor's advice. If you are taking the 23 mg donepezil tablet, swallow it whole; do not cut, crush, or chew it. Talk to your pediatrician regarding the use of this medicine in children. Special care may be needed. Overdosage: If you think you have taken too much of this medicine contact a poison control center or emergency room at once. NOTE: This medicine is only for you. Do not share this medicine with others. What if I miss a dose? If you miss a dose, take it as soon as you can. If it is almost time for your next dose, take only that dose, do not take double or extra doses. What may interact with this medicine? Do not take this medicine with any of the following medications: -certain medicines for fungal infections like itraconazole, fluconazole, posaconazole, and voriconazole -cisapride -dextromethorphan; quinidine -dofetilide -dronedarone -pimozide -quinidine -thioridazine -ziprasidone This medicine may also interact with the following medications: -antihistamines for allergy, cough and cold -atropine -bethanechol -carbamazepine -certain medicines for bladder problems like oxybutynin, tolterodine -certain medicines for Parkinson's disease like benztropine, trihexyphenidyl -certain medicines for stomach problems like dicyclomine, hyoscyamine -certain medicines for travel sickness like scopolamine -dexamethasone -ipratropium -NSAIDs, medicines for pain and inflammation, like ibuprofen or naproxen -other medicines for Alzheimer's disease -other medicines that prolong the QT interval (cause  an abnormal heart rhythm) -phenobarbital -phenytoin -rifampin, rifabutin or rifapentine This list may not describe all possible interactions.  Give your health care provider a list of all the medicines, herbs, non-prescription drugs, or dietary supplements you use. Also tell them if you smoke, drink alcohol, or use illegal drugs. Some items may interact with your medicine. What should I watch for while using this medicine? Visit your doctor or health care professional for regular checks on your progress. Check with your doctor or health care professional if your symptoms do not get better or if they get worse. You may get drowsy or dizzy. Do not drive, use machinery, or do anything that needs mental alertness until you know how this drug affects you. What side effects may I notice from receiving this medicine? Side effects that you should report to your doctor or health care professional as soon as possible: -allergic reactions like skin rash, itching or hives, swelling of the face, lips, or tongue -changes in vision -feeling faint or lightheaded, falls -problems with balance -redness, blistering, peeling or loosening of the skin, including inside the mouth -slow heartbeat, or palpitations -stomach pain -unusual bleeding or bruising, red or purple spots on the skin -vomiting -weight loss Side effects that usually do not require medical attention (report to your doctor or health care professional if they continue or are bothersome): -diarrhea, especially when starting treatment -headache -indigestion or heartburn -loss of appetite -muscle cramps -nausea This list may not describe all possible side effects. Call your doctor for medical advice about side effects. You may report side effects to FDA at 1-800-FDA-1088. Where should I keep my medicine? Keep out of reach of children. Store at room temperature between 15 and 30 degrees C (59 and 86 degrees F). Throw away any unused medicine after the expiration date. NOTE: This sheet is a summary. It may not cover all possible information. If you have questions about this medicine, talk  to your doctor, pharmacist, or health care provider.    2016, Elsevier/Gold Standard. (2014-03-16 07:51:52)

## 2016-03-14 NOTE — Progress Notes (Signed)
[facility]    Provider:  Dr [name] Referring Provider: Osborne Casco Fransico Him, MD Primary Care Physician:  Haywood Pao, MD  CC:  Short-term memory loss  HPI:  Nichole Diaz is a 81 y.o. female here as a referral from Dr. Osborne Casco for short-term memory loss. Past medical history of diabetes, hyperlipidemia, GERD. No alcohol use or history of smoking. Memory has been worsening over the past several months. She is here with her neighbor who also provides information.She is having difficulty working, doing book work, Her friend is here and provides information, for years patient has been able to open the safe and has been having difficulty opening the safe. She is getting invoicing mixed up. She loses things like her keys which is new. She lives alone, no accidents in the home, not late on bills at home, she is having difficulty getting into the computer and bank statements. Had to give up her work at [employer] because she couldn't do it anymore. Mother had dementia in her 82s dies in early 11s. No accidents, not getting lost. She procrastinates. No mood disorders. Noticed changes over a year ago, slowly progressive with recent worsening over the last 2 years. Sister with Alzheimers. No other focal neurologic deficits. No other associated symptoms. No inciting events or head trauma.  Reviewed notes, labs and imaging from outside physicians, which showed:  TSH, B12, CMP, CBC and RPR were ordered per Dr. Osborne Casco. Hemoglobin A1c 7.3. BUN 16, creatinine 1.1, TSH 3. I don't have results of B12, RPR will request this from Dr. Osborne Casco.   MRI of the brain in January 2011: Personally reviewed and agree with the following.  Findings:  There is no evidence for acute infarction, intracranial hemorrhage, mass lesion, hydrocephalus, or extra-axial fluid. There is mild age appropriate atrophy.  Mild chronic microvascular ischemic change can be seen in the periventricular and  subcortical white matter.  The brainstem and cerebellum are relatively spared by the chronic microvascular ischemic change.  Sagittal and coronal images demonstrate the cerebellar vermis, cerebellar hemispheres, and cerebellar tonsils all to be within normal limits. No brainstem abnormalities are seen. Mild pannus surrounds the the odontoid, but there is no significant cervicomedullary compression.  Pituitary gland unremarkable.  Calvarium grossly intact.  No significant sinus or mastoid disease.   IMPRESSION: Mild atrophy and small vessel disease.  No acute intracranial findings.  Good agreement with prior CT.  No evidence for posterior fossa ischemia.   MRA HEAD   Findings: Widely patent carotid and basilar arteries.  Vertebrals codominant.  Posterior cerebral arteries widely patent, as are the other visualized posterior fossa cerebellar branches.   Middle cerebral artery segments widely patent.  Focal narrowing of the right anterior cerebral artery between A1 and A2 segments estimated be 50%, not felt to be flow reducing.  No intracranial aneurysm. Small infundibulum projecting from the left posterior communicating artery origin is identified.   IMPRESSION: No evidence for vertebral basilar insufficiency.  Mild intracranial atherosclerotic change affecting the right anterior cerebral artery, not felt to be clinically significant.  No visible carotid stenosis or irregularity.  Review of Systems: Patient complains of symptoms per HPI as well as the following symptoms: Fatigue, feeling cold, diarrhea, anxiety, memory loss, confusion. Pertinent negatives per HPI. All others negative.   Social History   Social History  . Marital status: Widowed    Spouse name: Rush Landmark  . Number of children: N/A  . Years of education: 1   Occupational History  .  homemaker- retired    Social History Main Topics  . Smoking status: Never Smoker  . Smokeless tobacco: Never Used  . Alcohol use  No  . Drug use: No  . Sexual activity: Not on file   Other Topics Concern  . Not on file   Social History Narrative   Lives   Caffeine use: 1 drink per day    Family History  Problem Relation Age of Onset  . CVA Mother 37  . Heart attack Sister   . Heart failure Father   . Goiter Father   . Chronic Renal Failure Father   . Alzheimer's disease Sister     Past Medical History:  Diagnosis Date  . Diabetes mellitus ([place])   . GERD (gastroesophageal reflux disease)   . Hyperlipidemia   . Overactive bladder   . Rotator cuff tear 2003    Past Surgical History:  Procedure Laterality Date  . COLONOSCOPY  12/11/2011  . DIAGNOSTIC MAMMOGRAM  06/02/2014    Current Outpatient Prescriptions  Medication Sig Dispense Refill  . aspirin 81 MG tablet Take 81 mg by mouth daily.    Marland Kitchen atorvastatin (LIPITOR) 80 MG tablet Take 80 mg by mouth daily.    . Cholecalciferol (VITAMIN D3) 5000 units CAPS Take 1 capsule by mouth daily.    . hydrochlorothiazide (HYDRODIURIL) 25 MG tablet Take 25 mg by mouth daily.    . Omega-3 Fatty Acids (FISH OIL PO) Take 300 mg by mouth daily.    Marland Kitchen omeprazole (PRILOSEC) 20 MG capsule Take 20 mg by mouth daily.    . potassium chloride (KLOR-CON) 20 MEQ packet Take 20 mEq by mouth 2 (two) times daily.    Marland Kitchen SITagliptin Phosphate (JANUVIA PO) Take by mouth.     No current facility-administered medications for this visit.     Allergies as of 03/14/2016  . (No Known Allergies)    Vitals: BP (!) 158/65   Pulse 72   Ht 5\' 2"  (1.575 m)   Wt 173 lb 3.2 oz (78.6 kg)   BMI 31.68 kg/m  Last Weight:  Wt Readings from Last 1 Encounters:  03/14/16 173 lb 3.2 oz (78.6 kg)   Last Height:   Ht Readings from Last 1 Encounters:  03/14/16 5\' 2"  (1.575 m)   Physical exam: Exam: Gen: NAD, conversant, well nourised, obese, well groomed                     CV: RRR, no MRG. No Carotid Bruits. No peripheral edema, warm, nontender Eyes: Conjunctivae clear without  exudates or hemorrhage  Neuro: Detailed Neurologic Exam  Speech:    Speech is normal; fluent and spontaneous with normal comprehension.   MMSE - Mini Mental State Exam 03/14/2016  Orientation to time 3  Orientation to Place 4  Registration 3  Attention/ Calculation 5  Recall 1  Language- name 2 objects 2  Language- repeat 1  Language- follow 3 step command 3  Language- read & follow direction 1  Write a sentence 1  Copy design 1  Total score 25   Cognition:    The patient is oriented to person, place, and time;     recent and remote memory impaired ;     language fluent;     normal attention, concentration,     fund of knowledge impaired Cranial Nerves:    The pupils are equal, round, and reactive to light. Attempted funduscopic exam could not visualize due to small pupils. Visual  fields are full to finger confrontation. Extraocular movements are intact. Trigeminal sensation is intact and the muscles of mastication are normal. The face is symmetric. The palate elevates in the midline. Hearing intact. Voice is normal. Shoulder shrug is normal. The tongue has normal motion without fasciculations.   Coordination:    Normal finger to nose and heel to shin. Normal rapid alternating movements.   Gait:    Heel-toe and tandem gait are normal.   Motor Observation:    No asymmetry, no atrophy, and no involuntary movements noted. Tone:    Normal muscle tone.    Posture:    Posture is normal. normal erect    Strength:Mild prox LE weakness otherwise    Strength is V/V in the upper and lower limbs.      Sensation: intact to LT     Reflex Exam:  DTR's:    Deep tendon reflexes in the upper and lower extremities are brisk bilaterally.   Toes:    The toes are downgoing bilaterally.   Clonus:    Clonus is absent.       Assessment/Plan:  81 year old female with mild cognitive impairment. Discussed CREAD study ongoing in our clinic research team. Patient is going to think  about it and get back to [place].  Remember to drink plenty of fluid, eat healthy meals and do not skip any meals. Try to eat protein with a every meal and eat a healthy snack such as fruit or nuts in between meals. Try to keep a regular sleep-wake schedule and try to exercise daily, particularly in the form of walking, 20-30 minutes a day, if you can.   As far as your medications are concerned, I would like to suggest: If you do not join the clinical trial, would consider starting Aricept (Donepezil)  As far as diagnostic testing:  If you do not join the clinical trial, would consider repeat MRI of the brain and formal neurocognitive testing  I would like to see you back in 4-6 months unless you join the clinical trial, sooner if we need to. Please call us with any interim questions, concerns, problems, updates or refill requests.     Sarina Ill, MD  [facility] [address] [place], [place] [address]  Phone [phone] Fax [phone]

## 2016-03-17 DIAGNOSIS — G3184 Mild cognitive impairment, so stated: Secondary | ICD-10-CM | POA: Insufficient documentation

## 2016-03-18 ENCOUNTER — Telehealth: Payer: Self-pay | Admitting: Neurology

## 2016-03-18 NOTE — Telephone Encounter (Signed)
Dr [name]- see message. Can you place order for Aricept, MRI, formal neurocognitive testing? Thank you

## 2016-03-18 NOTE — Telephone Encounter (Signed)
Patient called, states she talked to [name] at July 28th visit and was to call back with her decision, patient has decided she would like to go with [name].

## 2016-03-19 ENCOUNTER — Other Ambulatory Visit: Payer: Self-pay | Admitting: Neurology

## 2016-03-19 MED ORDER — DONEPEZIL HCL 10 MG PO TABS: 10.0000 mg | ORAL_TABLET | Freq: Every day | ORAL | 3 refills | Status: DC

## 2016-03-19 NOTE — Telephone Encounter (Signed)
Called pt back and LVM. Advised I gave Dr [name] message from yesterday and she is going to place orders as discussed at [place]. Gave [facility] phone number if she has further questions.

## 2016-03-19 NOTE — Telephone Encounter (Signed)
Done. thanks

## 2016-03-19 NOTE — Addendum Note (Signed)
Addended by: Sarina Ill B on: 03/19/2016 05:08 PM   Modules accepted: Orders

## 2016-03-19 NOTE — Telephone Encounter (Signed)
Pt called would like a call from RN to touch base with her regarding prior msg

## 2016-05-29 DIAGNOSIS — L219 Seborrheic dermatitis, unspecified: Secondary | ICD-10-CM | POA: Diagnosis not present

## 2016-05-29 DIAGNOSIS — L304 Erythema intertrigo: Secondary | ICD-10-CM | POA: Diagnosis not present

## 2016-06-11 DIAGNOSIS — Z1231 Encounter for screening mammogram for malignant neoplasm of breast: Secondary | ICD-10-CM | POA: Diagnosis not present

## 2016-06-12 DIAGNOSIS — R946 Abnormal results of thyroid function studies: Secondary | ICD-10-CM | POA: Diagnosis not present

## 2016-06-12 DIAGNOSIS — E11319 Type 2 diabetes mellitus with unspecified diabetic retinopathy without macular edema: Secondary | ICD-10-CM | POA: Diagnosis not present

## 2016-06-12 DIAGNOSIS — E559 Vitamin D deficiency, unspecified: Secondary | ICD-10-CM | POA: Diagnosis not present

## 2016-06-12 DIAGNOSIS — I10 Essential (primary) hypertension: Secondary | ICD-10-CM | POA: Diagnosis not present

## 2016-06-19 DIAGNOSIS — R42 Dizziness and giddiness: Secondary | ICD-10-CM | POA: Diagnosis not present

## 2016-06-19 DIAGNOSIS — E559 Vitamin D deficiency, unspecified: Secondary | ICD-10-CM | POA: Diagnosis not present

## 2016-06-19 DIAGNOSIS — R946 Abnormal results of thyroid function studies: Secondary | ICD-10-CM | POA: Diagnosis not present

## 2016-06-19 DIAGNOSIS — Z1389 Encounter for screening for other disorder: Secondary | ICD-10-CM | POA: Diagnosis not present

## 2016-06-19 DIAGNOSIS — Z23 Encounter for immunization: Secondary | ICD-10-CM | POA: Diagnosis not present

## 2016-06-19 DIAGNOSIS — N3281 Overactive bladder: Secondary | ICD-10-CM | POA: Diagnosis not present

## 2016-06-19 DIAGNOSIS — E11319 Type 2 diabetes mellitus with unspecified diabetic retinopathy without macular edema: Secondary | ICD-10-CM | POA: Diagnosis not present

## 2016-06-19 DIAGNOSIS — Z0000 Encounter for general adult medical examination without abnormal findings: Secondary | ICD-10-CM | POA: Diagnosis not present

## 2016-06-19 DIAGNOSIS — E7800 Pure hypercholesterolemia, unspecified: Secondary | ICD-10-CM | POA: Diagnosis not present

## 2016-06-19 DIAGNOSIS — R413 Other amnesia: Secondary | ICD-10-CM | POA: Diagnosis not present

## 2016-06-19 DIAGNOSIS — Z6829 Body mass index (BMI) 29.0-29.9, adult: Secondary | ICD-10-CM | POA: Diagnosis not present

## 2016-06-19 DIAGNOSIS — I10 Essential (primary) hypertension: Secondary | ICD-10-CM | POA: Diagnosis not present

## 2016-09-05 ENCOUNTER — Other Ambulatory Visit: Payer: Self-pay | Admitting: Neurology

## 2016-10-09 DIAGNOSIS — D235 Other benign neoplasm of skin of trunk: Secondary | ICD-10-CM | POA: Diagnosis not present

## 2016-10-09 DIAGNOSIS — L821 Other seborrheic keratosis: Secondary | ICD-10-CM | POA: Diagnosis not present

## 2016-10-09 DIAGNOSIS — D1801 Hemangioma of skin and subcutaneous tissue: Secondary | ICD-10-CM | POA: Diagnosis not present

## 2016-10-09 DIAGNOSIS — L814 Other melanin hyperpigmentation: Secondary | ICD-10-CM | POA: Diagnosis not present

## 2016-12-16 DIAGNOSIS — E559 Vitamin D deficiency, unspecified: Secondary | ICD-10-CM | POA: Diagnosis not present

## 2016-12-16 DIAGNOSIS — E11319 Type 2 diabetes mellitus with unspecified diabetic retinopathy without macular edema: Secondary | ICD-10-CM | POA: Diagnosis not present

## 2016-12-16 DIAGNOSIS — R413 Other amnesia: Secondary | ICD-10-CM | POA: Diagnosis not present

## 2016-12-16 DIAGNOSIS — I10 Essential (primary) hypertension: Secondary | ICD-10-CM | POA: Diagnosis not present

## 2016-12-16 DIAGNOSIS — E7800 Pure hypercholesterolemia, unspecified: Secondary | ICD-10-CM | POA: Diagnosis not present

## 2016-12-16 DIAGNOSIS — K219 Gastro-esophageal reflux disease without esophagitis: Secondary | ICD-10-CM | POA: Diagnosis not present

## 2016-12-16 DIAGNOSIS — R946 Abnormal results of thyroid function studies: Secondary | ICD-10-CM | POA: Diagnosis not present

## 2016-12-16 DIAGNOSIS — Z1389 Encounter for screening for other disorder: Secondary | ICD-10-CM | POA: Diagnosis not present

## 2016-12-16 DIAGNOSIS — Z6830 Body mass index (BMI) 30.0-30.9, adult: Secondary | ICD-10-CM | POA: Diagnosis not present

## 2016-12-16 DIAGNOSIS — E668 Other obesity: Secondary | ICD-10-CM | POA: Diagnosis not present

## 2016-12-16 DIAGNOSIS — N3281 Overactive bladder: Secondary | ICD-10-CM | POA: Diagnosis not present

## 2017-01-06 ENCOUNTER — Other Ambulatory Visit: Payer: Self-pay | Admitting: Neurology

## 2017-03-16 DIAGNOSIS — H35372 Puckering of macula, left eye: Secondary | ICD-10-CM | POA: Diagnosis not present

## 2017-03-16 DIAGNOSIS — E113291 Type 2 diabetes mellitus with mild nonproliferative diabetic retinopathy without macular edema, right eye: Secondary | ICD-10-CM | POA: Diagnosis not present

## 2017-03-16 DIAGNOSIS — Z961 Presence of intraocular lens: Secondary | ICD-10-CM | POA: Diagnosis not present

## 2017-03-16 DIAGNOSIS — H52203 Unspecified astigmatism, bilateral: Secondary | ICD-10-CM | POA: Diagnosis not present

## 2017-03-23 ENCOUNTER — Other Ambulatory Visit: Payer: Self-pay | Admitting: Neurology

## 2017-03-25 ENCOUNTER — Other Ambulatory Visit: Payer: Self-pay | Admitting: Neurology

## 2017-05-05 ENCOUNTER — Other Ambulatory Visit: Payer: Self-pay | Admitting: Neurology

## 2017-05-20 ENCOUNTER — Ambulatory Visit (INDEPENDENT_AMBULATORY_CARE_PROVIDER_SITE_OTHER): Payer: Medicare Other | Admitting: Neurology

## 2017-05-20 ENCOUNTER — Encounter (INDEPENDENT_AMBULATORY_CARE_PROVIDER_SITE_OTHER): Payer: Self-pay

## 2017-05-20 ENCOUNTER — Encounter: Payer: Self-pay | Admitting: Neurology

## 2017-05-20 VITALS — BP 162/69 | HR 70 | Ht 62.0 in | Wt 169.0 lb

## 2017-05-20 DIAGNOSIS — R443 Hallucinations, unspecified: Secondary | ICD-10-CM | POA: Diagnosis not present

## 2017-05-20 DIAGNOSIS — E538 Deficiency of other specified B group vitamins: Secondary | ICD-10-CM | POA: Diagnosis not present

## 2017-05-20 DIAGNOSIS — R413 Other amnesia: Secondary | ICD-10-CM | POA: Diagnosis not present

## 2017-05-20 NOTE — Progress Notes (Signed)
[facility]    Provider:  Dr [name] Referring Provider: Osborne Casco Fransico Him, MD Primary Care Physician:  Haywood Pao, MD CC:  Short-term memory loss  Interval history 05/20/2017: Patient was seen in 2017. She did not follow through on the MRI of the brain or the formal neurocognitive testing. She is here with her grandson. Family is concerned. Patient is seeing people ont he roof, and people working in the trees in the back yard. No one else is seeing this. She pays the bills and only one bill has been missed, she is driving and no accidents, grandson says she seems normal to him he is unclear on memory changes.He is there weekly. Hallucinations started in the last year. No other changes or illnesses. Slowly progressive.   HPI:  Nichole Diaz is a 82 y.o. female here as a referral from Dr. Osborne Casco for short-term memory loss. Past medical history of diabetes, hyperlipidemia, GERD. No alcohol use or history of smoking. Memory has been worsening over the past several months. She is here with her neighbor who also provides information.She is having difficulty working, doing book work, Her friend is here and provides information, for years patient has been able to open the safe and has been having difficulty opening the safe. She is getting invoicing mixed up. She loses things like her keys which is new. She lives alone, no accidents in the home, not late on bills at home, she is having difficulty getting into the computer and bank statements. Had to give up her work at [employer] because she couldn't do it anymore. Mother had dementia in her 42s dies in early 68s. No accidents, not getting lost. She procrastinates. No mood disorders. Noticed changes over a year ago, slowly progressive with recent worsening over the last 2 years. Sister with Alzheimers. No other focal neurologic deficits. No other associated symptoms. No inciting events or head trauma.  Reviewed notes, labs and  imaging from outside physicians, which showed:  TSH, B12, CMP, CBC and RPR were ordered per Dr. Osborne Casco. Hemoglobin A1c 7.3. BUN 16, creatinine 1.1, TSH 3. I don't have results of B12, RPR will request this from Dr. Osborne Casco.   MRI of the brain in January 2011: Personally reviewed and agree with the following.  Findings: There is no evidence for acute infarction, intracranial hemorrhage, mass lesion, hydrocephalus, or extra-axial fluid. There is mild age appropriate atrophy. Mild chronic microvascular ischemic change can be seen in the periventricular and subcortical white matter. The brainstem and cerebellum are relatively spared by the chronic microvascular ischemic change. Sagittal and coronal images demonstrate the cerebellar vermis, cerebellar hemispheres, and cerebellar tonsils all to be within normal limits. No brainstem abnormalities are seen. Mild pannus surrounds the the odontoid, but there is no significant cervicomedullary compression. Pituitary gland unremarkable. Calvarium grossly intact. No significant sinus or mastoid disease.  IMPRESSION: Mild atrophy and small vessel disease. No acute intracranial findings. Good agreement with prior CT. No evidence for posterior fossa ischemia.  MRA HEAD  Findings: Widely patent carotid and basilar arteries. Vertebrals codominant. Posterior cerebral arteries widely patent, as are the other visualized posterior fossa cerebellar branches. Middle cerebral artery segments widely patent. Focal narrowing of the right anterior cerebral artery between A1 and A2 segments estimated be 50%, not felt to be flow reducing. No intracranial aneurysm. Small infundibulum projecting from the left posterior communicating artery origin is identified.  IMPRESSION: No evidence for vertebral basilar insufficiency. Mild intracranial atherosclerotic change affecting the right anterior  cerebral artery, not felt to be clinically  significant. No visible carotid stenosis or irregularity.  Review of Systems: Patient complains of symptoms per HPI as well as the following symptoms: hallucinations, memory changes. Pertinent negatives and positives per HPI. All others negative.   Social History   Social History  . Marital status: Widowed    Spouse name: Rush Landmark  . Number of children: N/A  . Years of education: 1   Occupational History  . homemaker- retired    Social History Main Topics  . Smoking status: Never Smoker  . Smokeless tobacco: Never Used  . Alcohol use No  . Drug use: No  . Sexual activity: Not on file   Other Topics Concern  . Not on file   Social History Narrative   Lives   Caffeine use: 1 drink per day    Family History  Problem Relation Age of Onset  . CVA Mother 19  . Heart attack Sister   . Heart failure Father   . Goiter Father   . Chronic Renal Failure Father   . Alzheimer's disease Sister     Past Medical History:  Diagnosis Date  . Diabetes mellitus ([place])   . GERD (gastroesophageal reflux disease)   . Hyperlipidemia   . Overactive bladder   . Rotator cuff tear 2003    Past Surgical History:  Procedure Laterality Date  . COLONOSCOPY  12/11/2011  . DIAGNOSTIC MAMMOGRAM  06/02/2014    Current Outpatient Prescriptions  Medication Sig Dispense Refill  . aspirin 81 MG tablet Take 81 mg by mouth daily.    Marland Kitchen atorvastatin (LIPITOR) 80 MG tablet Take 80 mg by mouth daily.    . Cholecalciferol (VITAMIN D3) 5000 units CAPS Take 1 capsule by mouth daily.    Marland Kitchen donepezil (ARICEPT) 10 MG tablet take 1 tablet by mouth at bedtime 30 tablet 0  . donepezil (ARICEPT) 10 MG tablet take 1 tablet by mouth at bedtime 30 tablet 0  . hydrochlorothiazide (HYDRODIURIL) 25 MG tablet Take 25 mg by mouth daily.    . Omega-3 Fatty Acids (FISH OIL PO) Take 300 mg by mouth daily.    Marland Kitchen omeprazole (PRILOSEC) 20 MG capsule Take 20 mg by mouth daily.    . potassium chloride (KLOR-CON) 20 MEQ packet  Take 20 mEq by mouth 2 (two) times daily.    Marland Kitchen JANUVIA 100 MG tablet Take 1 tablet by mouth daily.  0   No current facility-administered medications for this visit.     Allergies as of 05/20/2017  . (No Known Allergies)    Vitals: BP (!) 162/69   Pulse 70   Ht 5\' 2"  (1.575 m)   Wt 169 lb (76.7 kg)   BMI 30.91 kg/m  Last Weight:  Wt Readings from Last 1 Encounters:  05/20/17 169 lb (76.7 kg)   Last Height:   Ht Readings from Last 1 Encounters:  05/20/17 5\' 2"  (1.575 m)   MMSE - Mini Mental State Exam 05/20/2017 03/14/2016  Orientation to time 3 3  Orientation to Place 5 4  Registration 3 3  Attention/ Calculation 2 5  Recall 1 1  Language- name 2 objects 2 2  Language- repeat 0 1  Language- follow 3 step command 3 3  Language- read & follow direction 1 1  Write a sentence 1 1  Copy design 1 1  Total score 22 25    Physical exam: Exam: Gen: NAD, pleasant  CV: RRR, no MRG. No Carotid Bruits. No peripheral edema, warm, nontender Eyes: Conjunctivae clear without exudates or hemorrhage  Neuro: Detailed Neurologic Exam  Cranial Nerves:    The pupils are equal, round, and reactive to light. Visual fields are full to finger confrontation. Extraocular movements are intact. Trigeminal sensation is intact and the muscles of mastication are normal. The face is symmetric. The palate elevates in the midline. Hearing intact. Voice is normal. Shoulder shrug is normal. The tongue has normal motion without fasciculations.   Motor Observation:    No asymmetry, no atrophy, and no involuntary movements noted. Tone:    Normal muscle tone.    Posture:    Posture is normal. normal erect     Assessment/plan: 82 year old female returns a year later with worsening memory and hallucinations. Was here with a good friend last year and today with grandson who is concerned. She did not follow through on the plan last year and did not return. Reviewed with grandson and  patient. MMSE 25/30 last year now 22/30 indicating progression.   MRI of the brain to eval for dementia, hallucinations reversible causes or intracerebral etiologies Formal Neurocognitive testing Labs today  Orders Placed This Encounter  Procedures  . MR BRAIN WO CONTRAST  . B12 and Folate Panel  . Methylmalonic acid, serum  . CBC  . Comprehensive metabolic panel  . Urinalysis, Routine w reflex microscopic  . TSH  . Ammonia  . Ambulatory referral to Neuropsychology     Sarina Ill, MD  [facility] [address] [place], [place] [address]  Phone [phone] Fax [phone]  A total of 35 minutes was spent face-to-face with this patient. Over half this time was spent on counseling patient on the dementia diagnosis and different diagnostic and therapeutic options available.

## 2017-05-20 NOTE — Patient Instructions (Signed)
MRI of the brain Formal Neurocognitive testing Labs today

## 2017-05-21 ENCOUNTER — Other Ambulatory Visit: Payer: Self-pay | Admitting: Neurology

## 2017-05-21 DIAGNOSIS — N390 Urinary tract infection, site not specified: Secondary | ICD-10-CM

## 2017-05-22 ENCOUNTER — Encounter: Payer: Self-pay | Admitting: Psychology

## 2017-05-23 LAB — COMPREHENSIVE METABOLIC PANEL
ALBUMIN: 4.4 g/dL (ref 3.5–4.8)
ALK PHOS: 68 IU/L (ref 39–117)
ALT: 32 IU/L (ref 0–32)
AST: 31 IU/L (ref 0–40)
Albumin/Globulin Ratio: 1.9 (ref 1.2–2.2)
BILIRUBIN TOTAL: 0.5 mg/dL (ref 0.0–1.2)
BUN / CREAT RATIO: 15 (ref 12–28)
BUN: 13 mg/dL (ref 8–27)
CHLORIDE: 104 mmol/L (ref 96–106)
CO2: 22 mmol/L (ref 20–29)
CREATININE: 0.89 mg/dL (ref 0.57–1.00)
Calcium: 9.9 mg/dL (ref 8.7–10.3)
GFR calc Af Amer: 71 mL/min/{1.73_m2} (ref >59)
GFR calc non Af Amer: 62 mL/min/{1.73_m2} (ref >59)
GLUCOSE: 170 mg/dL — AB (ref 65–99)
Globulin, Total: 2.3 g/dL (ref 1.5–4.5)
Potassium: 4.3 mmol/L (ref 3.5–5.2)
Sodium: 146 mmol/L — ABNORMAL HIGH (ref 134–144)
Total Protein: 6.7 g/dL (ref 6.0–8.5)

## 2017-05-23 LAB — CBC
Hematocrit: 43.3 % (ref 34.0–46.6)
Hemoglobin: 15.1 g/dL (ref 11.1–15.9)
MCH: 29.7 pg (ref 26.6–33.0)
MCHC: 34.9 g/dL (ref 31.5–35.7)
MCV: 85 fL (ref 79–97)
PLATELETS: 234 10*3/uL (ref 150–379)
RBC: 5.08 x10E6/uL (ref 3.77–5.28)
RDW: 13.8 % (ref 12.3–15.4)
WBC: 5.4 10*3/uL (ref 3.4–10.8)

## 2017-05-23 LAB — B12 AND FOLATE PANEL
Folate: >20 ng/mL (ref >3.0)
Vitamin B-12: 327 pg/mL (ref 232–1245)

## 2017-05-23 LAB — URINALYSIS, ROUTINE W REFLEX MICROSCOPIC
BILIRUBIN UA: NEGATIVE
GLUCOSE, UA: NEGATIVE
KETONES UA: NEGATIVE
Leukocytes, UA: NEGATIVE
NITRITE UA: NEGATIVE
Protein, UA: NEGATIVE
RBC UA: NEGATIVE
SPEC GRAV UA: 1.023 (ref 1.005–1.030)
Urobilinogen, Ur: 0.2 mg/dL (ref 0.2–1.0)
pH, UA: 5 (ref 5.0–7.5)

## 2017-05-23 LAB — TSH: TSH: 3.84 u[IU]/mL (ref 0.450–4.500)

## 2017-05-23 LAB — AMMONIA: AMMONIA: 50 ug/dL (ref 19–87)

## 2017-05-23 LAB — METHYLMALONIC ACID, SERUM: Methylmalonic Acid: 353 nmol/L (ref 0–378)

## 2017-05-25 ENCOUNTER — Telehealth: Payer: Self-pay | Admitting: *Deleted

## 2017-05-25 NOTE — Telephone Encounter (Signed)
LVM for patient to call back. I will be informing her of her test results and Dr. Cathren Laine recommendations.

## 2017-05-25 NOTE — Telephone Encounter (Signed)
-----   Message from Melvenia Beam, MD sent at 05/23/2017  7:24 PM EDT ----- Glucose is elevated. I think she is boderline B12 deficient, she is low normal. I recommend a daily multivitamin with B12 in it. Otherwise labs normal including urinalysis (No urine infection).

## 2017-05-26 ENCOUNTER — Telehealth: Payer: Self-pay | Admitting: *Deleted

## 2017-05-26 NOTE — Telephone Encounter (Signed)
LVM again for pt to call back.

## 2017-05-26 NOTE — Telephone Encounter (Signed)
Nichole Diaz has left two messages requesting a return call from the patient.

## 2017-05-26 NOTE — Telephone Encounter (Signed)
I called the mobile number attached to her contact list and her son answered. He gave me her cell phone [phone]. I informed the patient of Dr. Cathren Laine result notes and recommendations to take a MVI w/ B12. She verbalized understanding and stated she would pick it up today.

## 2017-05-26 NOTE — Telephone Encounter (Signed)
-----   Message from Melvenia Beam, MD sent at 05/23/2017  7:24 PM EDT ----- Glucose is elevated. I think she is boderline B12 deficient, she is low normal. I recommend a daily multivitamin with B12 in it. Otherwise labs normal including urinalysis (No urine infection).

## 2017-05-26 NOTE — Telephone Encounter (Signed)
Pt grandson has called back and is asking for a call when RN Romelle Starcher is available

## 2017-05-26 NOTE — Telephone Encounter (Signed)
Returned call to son, HEATHER MCKENDREE (on [place]), and discussed results. He verbalized understanding and is aware of Dr. Cathren Laine recommendation for the pt to start taking a MVI w/ B12.

## 2017-06-19 DIAGNOSIS — R82998 Other abnormal findings in urine: Secondary | ICD-10-CM | POA: Diagnosis not present

## 2017-06-19 DIAGNOSIS — R946 Abnormal results of thyroid function studies: Secondary | ICD-10-CM | POA: Diagnosis not present

## 2017-06-19 DIAGNOSIS — E7800 Pure hypercholesterolemia, unspecified: Secondary | ICD-10-CM | POA: Diagnosis not present

## 2017-06-19 DIAGNOSIS — E559 Vitamin D deficiency, unspecified: Secondary | ICD-10-CM | POA: Diagnosis not present

## 2017-06-19 DIAGNOSIS — I10 Essential (primary) hypertension: Secondary | ICD-10-CM | POA: Diagnosis not present

## 2017-06-19 DIAGNOSIS — E11319 Type 2 diabetes mellitus with unspecified diabetic retinopathy without macular edema: Secondary | ICD-10-CM | POA: Diagnosis not present

## 2017-06-23 ENCOUNTER — Other Ambulatory Visit: Payer: Self-pay | Admitting: Neurology

## 2017-06-23 DIAGNOSIS — Z23 Encounter for immunization: Secondary | ICD-10-CM | POA: Diagnosis not present

## 2017-06-24 DIAGNOSIS — K219 Gastro-esophageal reflux disease without esophagitis: Secondary | ICD-10-CM | POA: Diagnosis not present

## 2017-06-24 DIAGNOSIS — E668 Other obesity: Secondary | ICD-10-CM | POA: Diagnosis not present

## 2017-06-24 DIAGNOSIS — Z6830 Body mass index (BMI) 30.0-30.9, adult: Secondary | ICD-10-CM | POA: Diagnosis not present

## 2017-06-24 DIAGNOSIS — G3184 Mild cognitive impairment, so stated: Secondary | ICD-10-CM | POA: Diagnosis not present

## 2017-06-24 DIAGNOSIS — R42 Dizziness and giddiness: Secondary | ICD-10-CM | POA: Diagnosis not present

## 2017-06-24 DIAGNOSIS — E559 Vitamin D deficiency, unspecified: Secondary | ICD-10-CM | POA: Diagnosis not present

## 2017-06-24 DIAGNOSIS — N3281 Overactive bladder: Secondary | ICD-10-CM | POA: Diagnosis not present

## 2017-06-24 DIAGNOSIS — Z0000 Encounter for general adult medical examination without abnormal findings: Secondary | ICD-10-CM | POA: Diagnosis not present

## 2017-06-24 DIAGNOSIS — E7800 Pure hypercholesterolemia, unspecified: Secondary | ICD-10-CM | POA: Diagnosis not present

## 2017-06-24 DIAGNOSIS — Z1389 Encounter for screening for other disorder: Secondary | ICD-10-CM | POA: Diagnosis not present

## 2017-06-24 DIAGNOSIS — E11319 Type 2 diabetes mellitus with unspecified diabetic retinopathy without macular edema: Secondary | ICD-10-CM | POA: Diagnosis not present

## 2017-06-24 DIAGNOSIS — R946 Abnormal results of thyroid function studies: Secondary | ICD-10-CM | POA: Diagnosis not present

## 2017-07-13 ENCOUNTER — Ambulatory Visit
Admission: RE | Admit: 2017-07-13 | Discharge: 2017-07-13 | Disposition: A | Discharge status: Home / Self Care | Payer: Medicare Other | Source: Ambulatory Visit | Attending: Neurology | Admitting: Neurology

## 2017-07-13 DIAGNOSIS — R413 Other amnesia: Secondary | ICD-10-CM

## 2017-07-14 ENCOUNTER — Telehealth: Payer: Self-pay | Admitting: *Deleted

## 2017-07-14 NOTE — Telephone Encounter (Addendum)
Called patient and discussed MRI results listed below and encouragement to follow through with the formal memory testing. She verbalized understanding and had no questions.   ----- Message from Nichole Beam, MD sent at 07/14/2017  8:34 AM EST ----- MRI of the brain shows no new acute processes such as strokes however ut shows more atrophy especially in the areas we associate with Dementia. She is encouraged to follow through with the formal memory testing.

## 2017-07-23 ENCOUNTER — Encounter: Payer: Medicare Other | Admitting: Psychology

## 2017-08-20 ENCOUNTER — Encounter: Payer: Self-pay | Admitting: Psychology

## 2017-08-20 ENCOUNTER — Ambulatory Visit (INDEPENDENT_AMBULATORY_CARE_PROVIDER_SITE_OTHER): Payer: PPO | Admitting: Psychology

## 2017-08-20 DIAGNOSIS — G301 Alzheimer's disease with late onset: Secondary | ICD-10-CM | POA: Diagnosis not present

## 2017-08-20 DIAGNOSIS — F0280 Dementia in other diseases classified elsewhere without behavioral disturbance: Secondary | ICD-10-CM

## 2017-08-20 DIAGNOSIS — R413 Other amnesia: Secondary | ICD-10-CM

## 2017-08-20 NOTE — Progress Notes (Signed)
NEUROBEHAVIORAL STATUS EXAM (CPT: [phone])  Name: Nichole Diaz Date of Birth: 1935/01/03 Date of Interview: 08/20/2017  Reason for Referral:  Nichole Diaz is a 83 y.o. female who is referred for neuropsychological evaluation by Dr. Sarina Diaz of [facility] due to concerns about memory loss. This patient is accompanied in the office by her grandson, Nichole Diaz, who supplements the history.  History of Presenting Problem:  This patient was seen for initial neurology consultation with Dr. Jaynee Diaz in 2017 (MMSE 25/30 on 03/14/2016). MRI brain and neurocognitive testing were ordered but the patient did not follow through. She saw Dr. Jaynee Diaz again on 05/20/2017, MMSE had declined to 22/30 and the patient's grandson reported visual hallucinations having started in the past year. Family history is reportedly significant for dementia in the patient's mother (deceased) and sister.   The patient underwent MRI brain without contrast on 07/13/2017 which reportedly revealed mild to moderate generalized cortical atrophy that is most pronounced in the mesial temporal lobes, progressed when compared to the 09/15/2009 MRI. Corpus callosal atrophy was also noted. Mild chronic medical vessel ischemic change, mildly progressed when compared to the previous MRI, was noted. There were no acute findings.  At today's visit, the patient denied any concerns about memory or cognitive functioning. When provided with specific examples of cognitive deficits, though, she often endorsed them occurring "every now and then". Her grandson, who sees her regularly and helps her with many IADLs, reported forgetfulness for conversations and events, repetition of statements/questions, and word finding difficulty. She continues to drive and he is very concerned about this. She hasn't had any accidents or gotten lost but she admits she is less sure of routes/directions. She manages her medications independently and uses a daily  pill planner; her grandson checks over this and has not noticed any difficulty. He did start helping her with paying her bills about 3 weeks ago, and in doing so, he sees that she was having significant difficulty managing this previously. She does not do any cooking but is able to fix a bowl of cereal for breakfast. She goes out to eat most days for other meals. Her grandson helps her with management of her appointments. She is able to keep up with house cleaning and laundry. She has a International aid/development worker she cares for. She lives alone.  She denies any physical complaints. She denies problems with balance or walking. She has not had any falls. Energy level is good. She denies any sleep difficulty. She does not take daytime naps. Her appetite is fine. She does not drink any alcohol. She denies depression or anxiety. Prior psychiatric history was denied. In the past several months, she has experienced visual and auditory hallucinations, per her grandson confidentially. She denied this. He also reported she is displaying some suspiciousness. There have not been acute episodes of disorientation or severe confusion, but she does get mildly agitated when someone disagrees with her, and this is new.    Social History: Born/Raised: [place] Education: 11 years; Quit high school in her senior year Occupational history: Homemaker Marital history: Widowed since 2010. She had two children and she has two grandchildren. Alcohol: None Tobacco: Former tobacco user, quit at least 30 years ago SA: None   Medical History: Past Medical History:  Diagnosis Date  . Diabetes mellitus ([place])   . GERD (gastroesophageal reflux disease)   . Hyperlipidemia   . Overactive bladder   . Rotator cuff tear 2003     Current Medications:  Outpatient Encounter Medications as of 08/20/2017  Medication Sig  . aspirin 81 MG tablet Take 81 mg by mouth daily.  Marland Kitchen atorvastatin (LIPITOR) 80 MG tablet Take 80 mg by mouth daily.  .  Cholecalciferol (VITAMIN D3) 5000 units CAPS Take 1 capsule by mouth daily.  Marland Kitchen donepezil (ARICEPT) 10 MG tablet take 1 tablet by mouth at bedtime  . donepezil (ARICEPT) 10 MG tablet take 1 tablet by mouth once daily at bedtime  . hydrochlorothiazide (HYDRODIURIL) 25 MG tablet Take 25 mg by mouth daily.  Marland Kitchen JANUVIA 100 MG tablet Take 1 tablet by mouth daily.  . Omega-3 Fatty Acids (FISH OIL PO) Take 300 mg by mouth daily.  Marland Kitchen omeprazole (PRILOSEC) 20 MG capsule Take 20 mg by mouth daily.  . potassium chloride (KLOR-CON) 20 MEQ packet Take 20 mEq by mouth 2 (two) times daily.   No facility-administered encounter medications on file as of 08/20/2017.      Behavioral Observations:   Appearance: Neatly, casually and appropriately dressed and groomed, appearing somewhat younger than her stated age Gait: Ambulated independently, no gross abnormalities observed Speech: Fluent; normal rate, rhythm and volume. No significant word finding difficulty, but speech was somewhat sparse and she used the same phrases frequently. Thought process: Linear, goal directed Affect: Mildly blunted but euthymic Interpersonal: Pleasant, appropriate   TESTING: There is medical necessity to proceed with neuropsychological assessment as the results will be used to aid in differential diagnosis and clinical decision-making and to inform specific treatment recommendations. Per the patient's grandson/caregiver and medical records reviewed, there has been a change in cognitive functioning and a reasonable suspicion of dementia (most likely AD but could be mixed dementia with vascular contribution).   In considering her level of current functioning, level of presumed impairment, nature of symptoms, emotional and behavioral responses during the interview, level of literacy, observed level of motivation, a battery of tests was selected and communicated to the psychometrician.  Following the clinical interview/neurobehavioral status  exam, the patient completed this full battery of neuropsychological testing with my psychometrician under my supervision.   PLAN: The patient and her grandson will return to see me for a follow-up session at which time her test performances and my impressions and treatment recommendations will be reviewed in detail.  Evaluation ongoing with full report to follow.

## 2017-08-20 NOTE — Progress Notes (Signed)
   Neuropsychology Note  Nichole Diaz completed 1 hour of neuropsychological testing with technician, Nichole Diaz, BS, under the supervision of Dr. Macarthur Critchley. The patient did not appear overtly distressed by the testing session, per behavioral observation or via self-report to the technician. Rest breaks were offered. [place] will return within 2 weeks for a feedback session with Nichole Diaz at which time her test performances, clinical impressions and treatment recommendations will be reviewed in detail. The patient understands she can contact our office should she require our assistance before this time.  60 minutes spent face-to-face with patient administering standardized tests Environmental education officer). 30 minutes spent scoring Environmental education officer).  Full report to follow.

## 2017-09-01 ENCOUNTER — Encounter: Payer: Medicare Other | Admitting: Psychology

## 2017-09-01 ENCOUNTER — Telehealth: Payer: Self-pay | Admitting: Neurology

## 2017-09-01 NOTE — Telephone Encounter (Signed)
Would go to ED if can;t see pcp soon or if symptoms worsneing, acute changes in confusion are usually due to UTI or other infection or dehydration agree needs to see pcp for workup first thanks

## 2017-09-01 NOTE — Telephone Encounter (Signed)
Pts son is wanting a call back pt has been exteremly confused. They are concerned and not sure on what to do. Please call to discuss

## 2017-09-01 NOTE — Telephone Encounter (Signed)
Called and spoke with [name], pt's emergency contact & grandson. He stated that the patient has had acute confusion & some incontinence for the last 3 days. I advised him to call pt's PCP as this may be infection related and not neurological in nature. He states he will likely take her keys as he caught her packing up her car and he may not feel comfortable leaving her alone for his upcoming trip. He states that they have a f/u coming up with Dr. Richrd Sox but he was curious if we could see her. Advised to call PCP and rule out infection or other medical cause first and to let us know. He will also scan her POA documents into mychart. I told him that I would let [name] know about this. He verbalized understanding and appreciation.

## 2017-09-02 DIAGNOSIS — R410 Disorientation, unspecified: Secondary | ICD-10-CM | POA: Diagnosis not present

## 2017-09-02 DIAGNOSIS — G3184 Mild cognitive impairment, so stated: Secondary | ICD-10-CM | POA: Diagnosis not present

## 2017-09-02 DIAGNOSIS — R5381 Other malaise: Secondary | ICD-10-CM | POA: Diagnosis not present

## 2017-09-02 DIAGNOSIS — I10 Essential (primary) hypertension: Secondary | ICD-10-CM | POA: Diagnosis not present

## 2017-09-02 DIAGNOSIS — R946 Abnormal results of thyroid function studies: Secondary | ICD-10-CM | POA: Diagnosis not present

## 2017-09-02 DIAGNOSIS — N3281 Overactive bladder: Secondary | ICD-10-CM | POA: Diagnosis not present

## 2017-09-02 DIAGNOSIS — N390 Urinary tract infection, site not specified: Secondary | ICD-10-CM | POA: Diagnosis not present

## 2017-09-02 DIAGNOSIS — E11319 Type 2 diabetes mellitus with unspecified diabetic retinopathy without macular edema: Secondary | ICD-10-CM | POA: Diagnosis not present

## 2017-09-02 DIAGNOSIS — Z6829 Body mass index (BMI) 29.0-29.9, adult: Secondary | ICD-10-CM | POA: Diagnosis not present

## 2017-09-02 NOTE — Telephone Encounter (Signed)
Called and spoke with [place]. I informed him that [name] was aware of the situation and that she recommends that if patient cannot see her PCP soon or if symptoms worsen, go to the ED. Acute changes in confusion are usually due to URI or other infection or dehydration. He verbalized understanding and appreciation for everything and stated that the patient saw her PCP this morning at 9. They collected urine, blood (testing electrolytes) and will call him with results as early as 2 pm today. He states she is doing pretty good today and is aware of where she is. They still have another appt with Dr. Richrd Sox and then following that an appt with [name]. He will call us in the meantime if patient needs to be seen sooner.

## 2017-09-07 ENCOUNTER — Encounter: Payer: Self-pay | Admitting: Psychology

## 2017-09-15 NOTE — Progress Notes (Signed)
NEUROPSYCHOLOGICAL EVALUATION   Name:    Nichole Diaz  Date of Birth:   1935/07/07 Date of Interview:  08/20/2017 Date of Testing:  08/20/2017   Date of Feedback:  09/17/2017       Background Information:  Reason for Referral:  Nichole Diaz is a 83 y.o. female referred by Dr. Sarina Ill of [facility] to assess her current level of cognitive functioning and assist in differential diagnosis. The current evaluation consisted of a review of available medical records, an interview with the patient and her grandson, [name], and the completion of a neuropsychological testing battery. Informed consent was obtained.  History of Presenting Problem (per interview on 08/20/2017):  This patient was seen for initial neurology consultation with [name] in 2017 (MMSE 25/30 on 03/14/2016). MRI brain and neurocognitive testing were ordered but the patient did not follow through. She saw [name] again on 05/20/2017, MMSE had declined to 22/30 and the patient's grandson reported visual hallucinations having started in the past year. Family history is reportedly significant for dementia in the patient's mother (deceased) and sister.   The patient underwent MRI brain without contrast on 07/13/2017 which reportedly revealed mild to moderate generalized cortical atrophy that is most pronounced in the mesial temporal lobes, progressed when compared to the 09/15/2009 MRI. Corpus callosal atrophy was also noted. Mild chronic medical vessel ischemic change, mildly progressed when compared to the previous MRI, was noted. There were no acute findings.  At today's visit, the patient denied any concerns about memory or cognitive functioning. When provided with specific examples of cognitive deficits, though, she often endorsed them occurring "every now and then". Her grandson, who sees her regularly and helps her with many IADLs, reported forgetfulness for conversations and events, repetition of statements/questions, and word  finding difficulty. She continues to drive, and he is very concerned about this. She hasn't had any accidents or gotten lost but she admits she is less sure of routes/directions. She manages her medications independently and uses a daily pill planner; her grandson checks over this and has not noticed any difficulty. He did start helping her with paying her bills about 3 weeks ago, and in doing so, he sees that she was having significant difficulty managing this previously. She does not do any cooking but is able to fix a bowl of cereal for breakfast. She goes out to eat most days for other meals. Her grandson helps her with management of her appointments. She is able to keep up with house cleaning and laundry. She has a International aid/development worker she cares for. She lives alone.  She denies any physical complaints. She denies problems with balance or walking. She has not had any falls. Energy level is good. She denies any sleep difficulty. She does not take daytime naps. Her appetite is fine. She does not drink any alcohol. She denies depression or anxiety. Prior psychiatric history was denied. In the past several months, she has experienced visual and auditory hallucinations, per her grandson confidentially. She denied this. He also reported she is displaying some suspiciousness. There have not been acute episodes of disorientation or severe confusion, but she does get mildly agitated when someone disagrees with her, and this is new.    Social History: Born/Raised: [place] Education: 11 years; Quit high school in her senior year Occupational history: Homemaker Marital history: Widowed since 2010. She had two children and she has two grandchildren. Alcohol: None Tobacco: Former tobacco user, quit at least 30 years ago  SA: None   Medical History:  Past Medical History:  Diagnosis Date  . Diabetes mellitus ([place])   . GERD (gastroesophageal reflux disease)   . Hyperlipidemia   . Overactive bladder   . Rotator cuff  tear 2003    Current medications:  Outpatient Encounter Medications as of 09/17/2017  Medication Sig  . aspirin 81 MG tablet Take 81 mg by mouth daily.  Marland Kitchen atorvastatin (LIPITOR) 80 MG tablet Take 80 mg by mouth daily.  . Cholecalciferol (VITAMIN D3) 5000 units CAPS Take 1 capsule by mouth daily.  Marland Kitchen donepezil (ARICEPT) 10 MG tablet take 1 tablet by mouth at bedtime  . donepezil (ARICEPT) 10 MG tablet take 1 tablet by mouth once daily at bedtime  . hydrochlorothiazide (HYDRODIURIL) 25 MG tablet Take 25 mg by mouth daily.  Marland Kitchen JANUVIA 100 MG tablet Take 1 tablet by mouth daily.  . Omega-3 Fatty Acids (FISH OIL PO) Take 300 mg by mouth daily.  Marland Kitchen omeprazole (PRILOSEC) 20 MG capsule Take 20 mg by mouth daily.  . potassium chloride (KLOR-CON) 20 MEQ packet Take 20 mEq by mouth 2 (two) times daily.   No facility-administered encounter medications on file as of 09/17/2017.      Current Examination:  Behavioral Observations:  Appearance: Neatly, casually and appropriately dressed and groomed, appearing somewhat younger than her stated age Gait: Ambulated independently, no gross abnormalities observed Speech: Fluent; normal rate, rhythm and volume. No significant word finding difficulty, but speech was somewhat sparse and she used the same phrases frequently. Thought process: Linear, goal directed Affect: Mildly blunted but euthymic Interpersonal: Pleasant, appropriate Orientation: Oriented to person, place and current month. Not oriented to date, year, ("1901"), or day of the week. Could not name the current President or his predecessor.  Tests Administered: . Test of Premorbid Functioning (TOPF) . Wechsler Adult Intelligence Scale-Fourth Edition (WAIS-IV): Similarities, Music therapist, Coding and Digit Span subtests . Wechsler Memory Scale-Fourth Edition (WMS-IV) Older Adult Version (ages 59-90): Logical Memory I, II and Recognition subtests  . [employer] Verbal Learning Test - 2nd Edition  (CVLT-2) Short Form . Repeatable Battery for the Assessment of Neuropsychological Status (RBANS) Form A:  Figure Copy and Recall subtests and Semantic Fluency subtest . Neuropsychological Assessment Battery (NAB) Language Module, Form 1: Naming subtest . [place] Diagnostic Aphasia Examination: Complex Ideational Material subtest . Controlled Oral Word Association Test (COWAT) . Trail Making Test A and B . Clock drawing test . Geriatric Depression Scale (GDS) 15 Item . Generalized Anxiety Disorder - 7 item screener (GAD-7)  Test Results: Note: Standardized scores are presented only for use by appropriately trained professionals and to allow for any future test-retest comparison. These scores should not be interpreted without consideration of all the information that is contained in the rest of the report. The most recent standardization samples from the test publisher or other sources were used whenever possible to derive standard scores; scores were corrected for age, gender, ethnicity and education when available.   Test Scores:  Test Name Raw Score Standardized Score Descriptor  TOPF 28/70 SS= 92 Average  WAIS-IV Subtests     Similarities 12/36 ss= 6 Low average  Block Design 16/66 ss= 7 Low average  Coding 11/135 ss= 4 Impaired  Digit Span Forward 11/16 ss= 12 High average  Digit Span Backward 6/16 ss= 8 Low end of average  WMS-IV Subtests     LM I 22/53 ss= 8 Low end of average  LM II 3/39 ss= 5 Borderline  LM  II Recognition 11/23 Cum %: 3-9 Impaired  RBANS Subtests     Figure Copy 15/20 Z= -1.2 Low average  Figure Recall 4/20 Z= -1.8 Borderline  Semantic Fluency 5 Z= -3.4 Severely impaired  CVLT-II Scores     Trial 1 1/9 Z= -3 Severely impaired  Trial 4 3/9 Z= -2.5 Impaired  Trials 1-4 total 9/36 T= 16 Severely impaired  SD Free Recall 4/9 Z= -1.5 Borderline  LD Free Recall 2/9 Z= -1.5 Borderline   LD Cued Recall 2/9 Z= -2.5 Impaired  Recognition Hits 9/9 Z= 0 Average    Recognition False Positives 8 Z= -2.5 Severely impaired  Forced Choice Recognition 8/9    NAB Language subtest     Naming 24/31 T= 36 Borderline  BDAE Subtest     Complex Ideational Material 10/12    COWAT-FAS 4 T= 27 Impaired  COWAT-Animals 7 T= 30 Impaired  Trail Making Test A  64" 0 errors T= 49 Average  Trail Making Test B  Pt unable   Impaired  Clock Drawing   Impaired  GDS-15 1/15  WNL  GAD-7 0/21  WNL      Description of Test Results:  Premorbid verbal intellectual abilities were estimated to have been within the average range based on a test of word reading. Psychomotor processing speed was average on Trails A but impaired on a more complex coding task. Basic auditory attention was high average, while more complex auditory attention/working memory was low end of average. Visual-spatial construction was low average. Language abilities were below expectation. Specifically, confrontation naming was borderline impaired, and semantic verbal fluency was impaired. Auditory comprehension of complex ideational material was slightly below expectation. With regard to verbal memory, encoding and acquisition of non-contextual information (i.e., word list) was severely impaired across four learning trials. After a brief distracter task, free recall was borderline impaired (4/9 items). After a delay, free recall was borderline impaired (2/9 items). Cued recall was impaired (2/9 items). Performance on a yes/no recognition task was impaired due to high number of false positive errors. On another verbal memory test, encoding and acquisition of contextual auditory information (i.e., short stories) was low end of average. After a delay, free recall was borderline impaired. Performance on a yes/no recognition task was impaired. With regard to non-verbal memory, delayed free recall of visual information was borderline impaired. Executive functioning was below expectation overall. Mental flexibility and  set-shifting were impaired; she was unable to complete Trails B. Verbal fluency with phonemic search restrictions was impaired. Verbal abstract reasoning was low average. Performance on a clock drawing task was impaired. On self-report measures of mood, the patient's responses were not indicative of clinically significant depression or anxiety at the present time.    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 Results of cognitive testing reveal significant impairment in multiple areas of cognitive function. Additionally, there is evidence that her cognitive deficits are interfering with her ability to manage complex tasks such as her finances. As such, diagnostic criteria for a dementia syndrome are met. Her testing profile is indicative of medial temporal lobe involvement in that her encoding/consolidation of new information is poor, and semantic retrieval is impaired. This coincides with neuroimaging showing mesial temporal lobe preferential atrophy. All of these findings suggest Alzheimer's disease. She also demonstrates slowed processing speed and executive dysfunction, which can be present in AD but can also be present in comorbid cerebrovascular disease.  Overall, I would classify her dementia as mild stage but approaching  moderate, especially given onset of hallucinations, which I feel are secondary to her dementia. Fortunately, she is not reporting significant depression or anxiety, and there is no evidence of underlying primary psychiatric disorder.    Recommendations/Plan: Based on the findings of the present evaluation, the following recommendations are offered:  1. She appears to be an appropriate candidate for cholinesterase inhibitor therapy. She is already taking Aricept. 2. I highly recommend that she and her family consider increased in-home care or relocation to assisted living. First of all, I am recommending that she discontinue driving, based on  the cognitive impairments noted on this evaluation. As such, she will need assistance with transportation. Additionally, due to cognitive deficits, she is more likely to make errors with medications, and these should be closely monitored. Pillboxes should be filled by someone else. Ideally these would be administered to her daily as well. She will also need assistance with appointments, and a family member or caregiver should attend all appointments with her. She should continue to have assistance with all management of finances/bills. Moving into a continuing care community would allow her needs to be met as her disease progresses. If she is going to stay in her home, increased caregiver support and supervision will not only help in terms of safety but also companionship and stimulation for the patient. 3. If not already in place, identifying PoA for healthcare and finances is recommended. Advanced care planning should also be completed (living will, DNR, etc).  4. The patient's grandson/family will benefit from education and support regarding her diagnosis. I provided written information and resources. They are also referred to the Alzheimer[employer] ([email or web address]).   Feedback to Patient: SHAINNA FAUX and her grandson returned for a feedback appointment on 09/17/2017 to review the results of her neuropsychological evaluation with this provider. 20 minutes face-to-face time was spent reviewing her test results, my impressions and my recommendations as detailed above.    Total time spent on this patient's case: 80 minutes for neurobehavioral status exam with psychologist (CPT code [phone]); 90 minutes of testing/scoring by psychometrician under psychologist's supervision (CPT codes [phone], [phone] units); 180 minutes for integration of patient data, interpretation of standardized test results and clinical data, clinical decision making, treatment planning and preparation of this report, and interactive  feedback with review of results to the patient/family by psychologist (CPT codes [phone], [phone] units).      Thank you for your referral of [facility]. Please feel free to contact me if you have any questions or concerns regarding this report.

## 2017-09-17 ENCOUNTER — Encounter: Payer: Self-pay | Admitting: Psychology

## 2017-09-17 ENCOUNTER — Ambulatory Visit (INDEPENDENT_AMBULATORY_CARE_PROVIDER_SITE_OTHER): Payer: PPO | Admitting: Psychology

## 2017-09-17 DIAGNOSIS — F0280 Dementia in other diseases classified elsewhere without behavioral disturbance: Secondary | ICD-10-CM

## 2017-09-17 DIAGNOSIS — G301 Alzheimer's disease with late onset: Principal | ICD-10-CM

## 2017-09-17 NOTE — Patient Instructions (Addendum)
Test results indicated mild dementia, most likely secondary to Alzheimer's disease.  Recommendations/Plan: Based on the findings of the present evaluation, the following recommendations are offered:  1. Continue Aricept. 2. I highly recommend that she and her family consider increased in-home care or relocation to assisted living. First of all, I am recommending that she discontinue driving, based on the cognitive impairments noted on this evaluation. As such, she will need assistance with transportation. Additionally, due to cognitive deficits, she is more likely to make errors with medications, and these should be closely monitored. Pillboxes should be filled by someone else. Ideally these would be administered to her daily as well. She will also need assistance with appointments, and a family member or caregiver should attend all appointments with her. She should continue to have assistance with all management of finances/bills. Moving into a continuing care community would allow her needs to be met as her disease progresses. If she is going to stay in her home, increased caregiver support and supervision will not only help in terms of safety but also companionship and stimulation for the patient. 3. If not already in place, identifying PoA for healthcare and finances is recommended. Advanced care planning should also be completed (living will, DNR, etc).  4. The patient's grandson/family will benefit from education and support regarding her diagnosis. I provided written information and resources. They are also referred to the [employer] ([email or web address]).

## 2017-10-06 ENCOUNTER — Encounter: Payer: Self-pay | Admitting: Neurology

## 2017-10-06 ENCOUNTER — Ambulatory Visit (INDEPENDENT_AMBULATORY_CARE_PROVIDER_SITE_OTHER): Payer: PPO | Admitting: Neurology

## 2017-10-06 VITALS — BP 158/76 | HR 69 | Ht 65.0 in | Wt 165.0 lb

## 2017-10-06 DIAGNOSIS — F0281 Dementia in other diseases classified elsewhere with behavioral disturbance: Secondary | ICD-10-CM | POA: Insufficient documentation

## 2017-10-06 DIAGNOSIS — G301 Alzheimer's disease with late onset: Secondary | ICD-10-CM

## 2017-10-06 DIAGNOSIS — F0280 Dementia in other diseases classified elsewhere without behavioral disturbance: Secondary | ICD-10-CM | POA: Diagnosis not present

## 2017-10-06 DIAGNOSIS — F02818 Dementia in other diseases classified elsewhere, unspecified severity, with other behavioral disturbance: Secondary | ICD-10-CM | POA: Insufficient documentation

## 2017-10-06 MED ORDER — DONEPEZIL HCL 10 MG PO TABS: 10.0000 mg | ORAL_TABLET | Freq: Every day | ORAL | 3 refills | Status: DC

## 2017-10-06 MED ORDER — SERTRALINE HCL 25 MG PO TABS: 25.0000 mg | ORAL_TABLET | Freq: Every day | ORAL | 11 refills | Status: DC

## 2017-10-06 NOTE — Progress Notes (Signed)
[facility]    Provider:  Dr [name] Referring Provider: Osborne Casco Fransico Him, MD Primary Care Physician:  Haywood Pao, MD CC:  Short-term memory loss  Interval history 10/06/2017: Discussed Alzheimer's disease with patient and grandson. She has anxiety, worsening.   personally reviewed MRI brain images and discussed with family: This MRI of the brain without contrast shows the following: 1.    Mild to moderate generalized cortical atrophy that is most pronounced in the mesial temporal lobes. This has progressed when compared to the 09/15/2009 MRI. Corpus callosal atrophy is also noted. 2.    Mild chronic medical vessel ischemic change, mildly progressed when compared to the previous MRI. 3.    There were no acute findings.  Interval history 05/20/2017: Patient was seen in 2017. She did not follow through on the MRI of the brain or the formal neurocognitive testing. She is here with her grandson. Family is concerned. Patient is seeing people ont he roof, and people working in the trees in the back yard. No one else is seeing this. She pays the bills and only one bill has been missed, she is driving and no accidents, grandson says she seems normal to him he is unclear on memory changes.He is there weekly. Hallucinations started in the last year. No other changes or illnesses. Slowly progressive.   HPI:  Nichole Diaz is a 83 y.o. female here as a referral from Dr. Osborne Casco for short-term memory loss. Past medical history of diabetes, hyperlipidemia, GERD. No alcohol use or history of smoking. Memory has been worsening over the past several months. She is here with her neighbor who also provides information.She is having difficulty working, doing book work, Her friend is here and provides information, for years patient has been able to open the safe and has been having difficulty opening the safe. She is getting invoicing mixed up. She loses things like her keys which is new.  She lives alone, no accidents in the home, not late on bills at home, she is having difficulty getting into the computer and bank statements. Had to give up her work at [employer] because she couldn't do it anymore. Mother had dementia in her 70s dies in early 43s. No accidents, not getting lost. She procrastinates. No mood disorders. Noticed changes over a year ago, slowly progressive with recent worsening over the last 2 years. Sister with Alzheimers. No other focal neurologic deficits. No other associated symptoms. No inciting events or head trauma.  Reviewed notes, labs and imaging from outside physicians, which showed:  TSH, B12, CMP, CBC and RPR were ordered per Dr. Osborne Casco. Hemoglobin A1c 7.3. BUN 16, creatinine 1.1, TSH 3. I don't have results of B12, RPR will request this from Dr. Osborne Casco.   MRI of the brain in January 2011: Personally reviewed and agree with the following.  Findings: There is no evidence for acute infarction, intracranial hemorrhage, mass lesion, hydrocephalus, or extra-axial fluid. There is mild age appropriate atrophy. Mild chronic microvascular ischemic change can be seen in the periventricular and subcortical white matter. The brainstem and cerebellum are relatively spared by the chronic microvascular ischemic change. Sagittal and coronal images demonstrate the cerebellar vermis, cerebellar hemispheres, and cerebellar tonsils all to be within normal limits. No brainstem abnormalities are seen. Mild pannus surrounds the the odontoid, but there is no significant cervicomedullary compression. Pituitary gland unremarkable. Calvarium grossly intact. No significant sinus or mastoid disease.  IMPRESSION: Mild atrophy and small vessel disease. No acute intracranial findings.  Good agreement with prior CT. No evidence for posterior fossa ischemia.  MRA HEAD  Findings: Widely patent carotid and basilar arteries. Vertebrals codominant. Posterior  cerebral arteries widely patent, as are the other visualized posterior fossa cerebellar branches. Middle cerebral artery segments widely patent. Focal narrowing of the right anterior cerebral artery between A1 and A2 segments estimated be 50%, not felt to be flow reducing. No intracranial aneurysm. Small infundibulum projecting from the left posterior communicating artery origin is identified.  IMPRESSION: No evidence for vertebral basilar insufficiency. Mild intracranial atherosclerotic change affecting the right anterior cerebral artery, not felt to be clinically significant. No visible carotid stenosis or irregularity.  Review of Systems: Patient complains of symptoms per HPI as well as the following symptoms: hallucinations, memory changes. Pertinent negatives and positives per HPI. All others negative.   Social History   Socioeconomic History  . Marital status: Widowed    Spouse name: Rush Landmark  . Number of children: 2  . Years of education: 75  . Highest education level: High school graduate  Social Needs  . Financial resource strain: Not on file  . Food insecurity - worry: Not on file  . Food insecurity - inability: Not on file  . Transportation needs - medical: Not on file  . Transportation needs - non-medical: Not on file  Occupational History  . Occupation: homemaker- retired  Tobacco Use  . Smoking status: Former Smoker    Last attempt to quit: 1988    Years since quitting: 31.1  . Smokeless tobacco: Never Used  . Tobacco comment: Quit at least 30 years ago  Substance and Sexual Activity  . Alcohol use: No  . Drug use: No  . Sexual activity: Not on file  Other Topics Concern  . Not on file  Social History Narrative   Lives alone, family support   Caffeine use: 1 drink per day   Right handed    Family History  Problem Relation Age of Onset  . CVA Mother 46  . Heart attack Sister   . Heart failure Father   . Goiter Father   . Chronic Renal Failure  Father   . Alzheimer's disease Sister     Past Medical History:  Diagnosis Date  . Diabetes mellitus ([place])   . GERD (gastroesophageal reflux disease)   . Hyperlipidemia   . Overactive bladder   . Rotator cuff tear 2003    Past Surgical History:  Procedure Laterality Date  . COLONOSCOPY  12/11/2011  . DIAGNOSTIC MAMMOGRAM  06/02/2014    Current Outpatient Medications  Medication Sig Dispense Refill  . aspirin 81 MG tablet Take 81 mg by mouth daily.    . Cholecalciferol (VITAMIN D3) 5000 units CAPS Take 1 capsule by mouth daily.    Marland Kitchen donepezil (ARICEPT) 10 MG tablet Take 1 tablet (10 mg total) by mouth at bedtime. 30 tablet 3  . hydrochlorothiazide (HYDRODIURIL) 25 MG tablet Take 25 mg by mouth daily.    Marland Kitchen JANUVIA 100 MG tablet Take 1 tablet by mouth daily.  0  . Omega-3 Fatty Acids (FISH OIL PO) Take 300 mg by mouth daily.    . potassium chloride (KLOR-CON) 20 MEQ packet Take 20 mEq by mouth 2 (two) times daily.    . sertraline (ZOLOFT) 25 MG tablet Take 1 tablet (25 mg total) by mouth daily. 30 tablet 11   No current facility-administered medications for this visit.     Allergies as of 10/06/2017  . (No Known Allergies)  Vitals: BP (!) 158/76 (BP Location: Right Arm, Patient Position: Sitting)   Pulse 69   Ht 5\' 5"  (1.651 m)   Wt 165 lb (74.8 kg)   BMI 27.46 kg/m  Last Weight:  Wt Readings from Last 1 Encounters:  10/06/17 165 lb (74.8 kg)   Last Height:   Ht Readings from Last 1 Encounters:  10/06/17 5\' 5"  (1.651 m)   MMSE - Mini Mental State Exam 05/20/2017 03/14/2016  Orientation to time 3 3  Orientation to Place 5 4  Registration 3 3  Attention/ Calculation 2 5  Recall 1 1  Language- name 2 objects 2 2  Language- repeat 0 1  Language- follow 3 step command 3 3  Language- read & follow direction 1 1  Write a sentence 1 1  Copy design 1 1  Total score 22 25    Physical exam: Exam: Gen: NAD, pleasant                   CV: RRR, no MRG. No Carotid  Bruits. No peripheral edema, warm, nontender Eyes: Conjunctivae clear without exudates or hemorrhage  Neuro: Detailed Neurologic Exam  Cranial Nerves:    The pupils are equal, round, and reactive to light. Visual fields are full to finger confrontation. Extraocular movements are intact. Trigeminal sensation is intact and the muscles of mastication are normal. The face is symmetric. The palate elevates in the midline. Hearing intact. Voice is normal. Shoulder shrug is normal. The tongue has normal motion without fasciculations.   Motor Observation:    No asymmetry, no atrophy, and no involuntary movements noted. Tone:    Normal muscle tone.    Posture:    Posture is normal. normal erect     Assessment/plan: 83 year old female recently diagnosed with Alzheimer's dementia. Formal Neurocognitive testing diagnosed Mild dementia most likely secondary to Alzheimer's disease. Here with grandson who is her caretaker.   - MRI of the brain to eval for dementia, hallucinations reversible causes or intracerebral etiologies: Mild to moderate generalized cortical atrophy that is most pronounced in the mesial temporal lobes c/w Alzheimer's pathology - Formal Neurocognitive testing- Diagnosed with Alzheimer's disease - Labs today- were unremarkable, B12, TSH normal - anxiety- start Zoloft  Discussed the following again with family as per Dr. Marcia Brash notes:  - discontinue driving - Start Namenda in the future - Recommend more supervision - help with medication management - PoA for healthcare with grandson - Yolanda Bonine is lovely as is patient    Sarina Ill, MD  [facility] [address] [place], [place] [address]  Phone [phone] Fax [phone]  A total of 25 minutes was spent face-to-face with this patient. Over half this time was spent on counseling patient on the dementia diagnosis and different diagnostic and therapeutic options available.

## 2017-10-06 NOTE — Patient Instructions (Signed)
Alzheimer Disease Alzheimer disease is a brain disease that affects memory, thinking, and behavior. People with Alzheimer disease lose mental abilities, and the disease gets worse over time. Survival with Alzheimer disease ranges from several years to as long as 20 years. What are the causes? This condition develops when a protein called beta-amyloid forms deposits in the brain. It is not known what causes these deposits to form. What increases the risk? This condition is more likely to develop in people who:  Are elderly.  Have a family history of dementia.  Have had a brain injury.  Have heart or blood vessel disease.  Have had a stroke.  Have high blood pressure or high cholesterol.  Have diabetes. What are the signs or symptoms? Symptoms of this condition happen in three stages, which often overlap. Early stage In this stage, you may continue to be independent. You may still be able to drive, work, and be social. Symptoms in this stage include:  Minor memory problems, such as forgetting a name or what you read.  Difficulty with:  Paying attention.  Communicating.  Doing familiar tasks.  Learning new things.  Needing more time to do daily activities.  Anxiety.  Social withdrawal.  Loss of motivation. Moderate stage In this stage, you will start to need care. This stage usually lasts the longest. Symptoms in this stage include:  Difficulty with expressing thoughts.  Memory loss that affects daily life. This can include forgetting:  Your address or phone number.  Events that have happened.  Parts of your personal history, like where you went to school.  Confusion about where you are or what time it is.  Difficulty in judging distance.  Changes in personality, mood, and behavior. You may be moody, irritable, angry, frustrated, fearful, anxious, or suspicious.  Poor reasoning and judgment.  Delusions or hallucinations.  Changes in sleep  patterns.  Wandering and getting lost. Severe stage In the final stage, you will need help with your personal care and dailyactivities. Symptoms in this stage include:  Worsening memory loss.  Personality changes.  Loss of awareness of your surroundings.  Changes in physical abilities, including the ability to walk, sit, and swallow.  Difficulty in communicating.  Inability to control the bladder and bowels.  Increasing confusion.  Increasing disruptive behavior. How is this diagnosed? This condition is diagnosed with an assessment by your health care provider. During this assessment, your health care provider will talk with you and your family, friends, or caregivers about your symptoms. A thorough medical history will be taken, and you will have a physical exam and tests. Tests may include:  Lab tests, such as blood or urine tests.  Imaging tests, such as a CT scan, PET scan, or MRI.  A lumbar puncture. This test involves removing and testing a small amount of the fluid that surrounds the brain and spinal cord.  An electroencephalogram (EEG). In this test, small metal discs are used to measure electrical activity in the brain.  Memory tests, cognitive tests, and neuropsychological tests. These tests evaluate brain function. How is this treated? At this time, there is no treatment to cure Alzheimer disease or stop it from getting worse. The goals of treatment are:  To slow down the disease.  To manage behavioral problems.  To provide you with a safe environment.  To make life easier for you and your caregivers. The following treatment options are available:  Medicines. Medicines may help to slow down memory loss and control behavioral symptoms.    memory loss and control behavioral symptoms.  Talk therapy. Talk therapy provides you with education, support, and memory aids. It is most helpful in the early stages of the condition.  Counseling or spiritual guidance. It is normal to have a lot of feelings,  including anger, relief, fear, and isolation. Counseling and guidance can help you deal with these feelings.  Caregiving. This involves having caregivers help you with your daily activities. Caregivers may be family members, friends, or trained medical professionals. Caregiving can be done at home or outside the home.  Family support groups. These provide education, emotional support, and information about community resources to family members who are taking care of you.  Follow these instructions at home: Medicines  Take over-the-counter and prescription medicines only as told by your health care provider.  Avoid taking medicines that can affect thinking, such as pain or sleeping medicines. Lifestyle   Make healthy lifestyle choices: ? Be physically active as told by your health care provider. ? Do not use any tobacco products, such as cigarettes, chewing tobacco, and e-cigarettes. If you need help quitting, ask your health care provider. ? Eat a healthy diet. ? Practice stress-management techniques when you get stressed. ? Stay social.  Drink enough fluid to keep your urine clear or pale yellow.  Make sure to get quality sleep. These tips can help you get a good night's rest: ? Avoid napping during the day. ? Keep your sleeping area dark and cool. ? Avoid exercising during the few hours before you go to bed. ? Avoid caffeine products in the evening. General instructions  Work with your health care provider to determine what you need help with and what your safety needs are.  If you were given a bracelet that tracks your location, make sure to wear it.  Keep all follow-up visits as told by your health care provider. This is important.  If you have questions or would like additional support, you may contact The Alzheimer's Association: ? 24-hour helpline: 1-800-272-3900 ? Website: www.alz.org Contact a health care provider if:  You have nausea, vomiting, or trouble with  eating.  You have dizziness, or weakness.  You have new or worsening trouble with sleeping.  You or your family members become concerned for your safety. Get help right away if:  You develop chest pain or difficulty with breathing.  You pass out. This information is not intended to replace advice given to you by your health care provider. Make sure you discuss any questions you have with your health care provider. Document Released: 04/15/2004 Document Revised: 04/04/2016 Document Reviewed: 05/02/2015 Elsevier Interactive Patient Education  2018 [employer]  

## 2017-12-07 ENCOUNTER — Other Ambulatory Visit: Payer: Self-pay | Admitting: Neurology

## 2017-12-07 DIAGNOSIS — G301 Alzheimer's disease with late onset: Principal | ICD-10-CM

## 2017-12-07 DIAGNOSIS — F0280 Dementia in other diseases classified elsewhere without behavioral disturbance: Secondary | ICD-10-CM

## 2018-01-12 ENCOUNTER — Other Ambulatory Visit: Payer: Self-pay | Admitting: Neurology

## 2018-01-12 DIAGNOSIS — F0280 Dementia in other diseases classified elsewhere without behavioral disturbance: Secondary | ICD-10-CM

## 2018-01-12 DIAGNOSIS — G301 Alzheimer's disease with late onset: Principal | ICD-10-CM

## 2018-02-11 ENCOUNTER — Telehealth: Payer: Self-pay | Admitting: Neurology

## 2018-02-11 ENCOUNTER — Encounter: Payer: Self-pay | Admitting: *Deleted

## 2018-02-11 NOTE — Telephone Encounter (Signed)
Absolutely, please write the letter. Get permission to state diagnosis of alzheimers in the letter.

## 2018-02-11 NOTE — Telephone Encounter (Signed)
Spoke with Armida Sans (on DPR) and he gave permission to say in letter that pt has alzheimer's.

## 2018-02-11 NOTE — Telephone Encounter (Signed)
Letter written and ready for MD signature.

## 2018-02-11 NOTE — Telephone Encounter (Signed)
Pt son(on DPR-[place],William D [phone]) has called he is asking that Dr [name] prepares a letter for a Jury Duty notification pt received.  Pt son does not feel pt is capable of serving on a jury duty.  Son(William) is asking that the letter be mailed to his XNA:TFTDDUK Round(grandson of pt) [place].  Pt son(William has not requested a call back)

## 2018-02-12 NOTE — Telephone Encounter (Signed)
Envelope with letter addressed to pt's grandson Nichole Diaz per son's request. Placed with outgoing mail.

## 2018-04-05 ENCOUNTER — Ambulatory Visit: Payer: PPO | Admitting: Neurology

## 2018-04-05 ENCOUNTER — Encounter: Payer: Self-pay | Admitting: Neurology

## 2018-05-20 ENCOUNTER — Other Ambulatory Visit: Payer: Self-pay | Admitting: Neurology

## 2018-05-20 MED ORDER — LORAZEPAM 0.5 MG PO TABS: 0.5000 mg | ORAL_TABLET | Freq: Four times a day (QID) | ORAL | 2 refills | Status: DC | PRN

## 2018-05-24 ENCOUNTER — Ambulatory Visit (INDEPENDENT_AMBULATORY_CARE_PROVIDER_SITE_OTHER): Payer: PPO | Admitting: Neurology

## 2018-05-24 ENCOUNTER — Encounter: Payer: Self-pay | Admitting: Neurology

## 2018-05-24 VITALS — BP 157/68 | HR 79 | Ht 64.0 in | Wt 165.4 lb

## 2018-05-24 DIAGNOSIS — F0391 Unspecified dementia with behavioral disturbance: Secondary | ICD-10-CM | POA: Insufficient documentation

## 2018-05-24 DIAGNOSIS — F0281 Dementia in other diseases classified elsewhere with behavioral disturbance: Secondary | ICD-10-CM | POA: Diagnosis not present

## 2018-05-24 DIAGNOSIS — R300 Dysuria: Secondary | ICD-10-CM

## 2018-05-24 DIAGNOSIS — F0280 Dementia in other diseases classified elsewhere without behavioral disturbance: Secondary | ICD-10-CM | POA: Diagnosis not present

## 2018-05-24 DIAGNOSIS — R451 Restlessness and agitation: Secondary | ICD-10-CM | POA: Diagnosis not present

## 2018-05-24 DIAGNOSIS — G301 Alzheimer's disease with late onset: Secondary | ICD-10-CM

## 2018-05-24 DIAGNOSIS — F03918 Unspecified dementia, unspecified severity, with other behavioral disturbance: Secondary | ICD-10-CM | POA: Insufficient documentation

## 2018-05-24 MED ORDER — QUETIAPINE FUMARATE 25 MG PO TABS: 25.0000 mg | ORAL_TABLET | Freq: Every day | ORAL | 11 refills | Status: DC

## 2018-05-24 NOTE — Progress Notes (Signed)
[facility]    Provider:  Dr [name] Referring Provider: Osborne Casco Fransico Him, MD Primary Care Physician:  Haywood Pao, MD CC:  Alzheimers  Interval history 05/24/2018: She is not sleeping well. She is here with her grandson. She is having behavioral problems. She is having delusions like her son is Risk analyst out. She doesn't feel like her house is hers. She sleeps a few hours and then wakes at 1am - 7am. She does not nap during the day. She gets into bed at 9pm and sleeps only 4 hours. Discussed elaying sleeping at night until 11pm. And discussed Seroquel. Lorazepam as needed acutely for frustration and agitation and times she cannot be redirected. Discussed the significant risks of using Benzos and atypical neuroleptics int he elderly, not fda approved, has a black box waribg for morbidity and mortality.  Interval history 10/06/2017: Discussed Alzheimer's disease with patient and grandson. She has anxiety, worsening.   personally reviewed MRI brain images and discussed with family: This MRI of the brain without contrast shows the following: 1.    Mild to moderate generalized cortical atrophy that is most pronounced in the mesial temporal lobes. This has progressed when compared to the 09/15/2009 MRI. Corpus callosal atrophy is also noted. 2.    Mild chronic medical vessel ischemic change, mildly progressed when compared to the previous MRI. 3.    There were no acute findings.  Interval history 05/20/2017: Patient was seen in 2017. She did not follow through on the MRI of the brain or the formal neurocognitive testing. She is here with her grandson. Family is concerned. Patient is seeing people ont he roof, and people working in the trees in the back yard. No one else is seeing this. She pays the bills and only one bill has been missed, she is driving and no accidents, grandson says she seems normal to him he is unclear on memory changes.He is there weekly.  Hallucinations started in the last year. No other changes or illnesses. Slowly progressive.   HPI:  Nichole Diaz is a 83 y.o. female here as a referral from Dr. Osborne Casco for short-term memory loss. Past medical history of diabetes, hyperlipidemia, GERD. No alcohol use or history of smoking. Memory has been worsening over the past several months. She is here with her neighbor who also provides information.She is having difficulty working, doing book work, Her friend is here and provides information, for years patient has been able to open the safe and has been having difficulty opening the safe. She is getting invoicing mixed up. She loses things like her keys which is new. She lives alone, no accidents in the home, not late on bills at home, she is having difficulty getting into the computer and bank statements. Had to give up her work at [employer] because she couldn't do it anymore. Mother had dementia in her 46s dies in early 30s. No accidents, not getting lost. She procrastinates. No mood disorders. Noticed changes over a year ago, slowly progressive with recent worsening over the last 2 years. Sister with Alzheimers. No other focal neurologic deficits. No other associated symptoms. No inciting events or head trauma.  Reviewed notes, labs and imaging from outside physicians, which showed:  TSH, B12, CMP, CBC and RPR were ordered per Dr. Osborne Casco. Hemoglobin A1c 7.3. BUN 16, creatinine 1.1, TSH 3. I don't have results of B12, RPR will request this from Dr. Osborne Casco.   MRI of the brain in January 2011: Personally reviewed and  agree with the following.  Findings: There is no evidence for acute infarction, intracranial hemorrhage, mass lesion, hydrocephalus, or extra-axial fluid. There is mild age appropriate atrophy. Mild chronic microvascular ischemic change can be seen in the periventricular and subcortical white matter. The brainstem and cerebellum are relatively spared by the chronic  microvascular ischemic change. Sagittal and coronal images demonstrate the cerebellar vermis, cerebellar hemispheres, and cerebellar tonsils all to be within normal limits. No brainstem abnormalities are seen. Mild pannus surrounds the the odontoid, but there is no significant cervicomedullary compression. Pituitary gland unremarkable. Calvarium grossly intact. No significant sinus or mastoid disease.  IMPRESSION: Mild atrophy and small vessel disease. No acute intracranial findings. Good agreement with prior CT. No evidence for posterior fossa ischemia.  MRA HEAD  Findings: Widely patent carotid and basilar arteries. Vertebrals codominant. Posterior cerebral arteries widely patent, as are the other visualized posterior fossa cerebellar branches. Middle cerebral artery segments widely patent. Focal narrowing of the right anterior cerebral artery between A1 and A2 segments estimated be 50%, not felt to be flow reducing. No intracranial aneurysm. Small infundibulum projecting from the left posterior communicating artery origin is identified.  IMPRESSION: No evidence for vertebral basilar insufficiency. Mild intracranial atherosclerotic change affecting the right anterior cerebral artery, not felt to be clinically significant. No visible carotid stenosis or irregularity.  Review of Systems: Patient complains of symptoms per HPI as well as the following symptoms: hallucinations, memory changes. Pertinent negatives and positives per HPI. All others negative.   Social History   Socioeconomic History  . Marital status: Widowed    Spouse name: Rush Landmark  . Number of children: 2  . Years of education: 7  . Highest education level: High school graduate  Occupational History  . Occupation: homemaker- retired  Scientific laboratory technician  . Financial resource strain: Not on file  . Food insecurity:    Worry: Not on file    Inability: Not on file  . Transportation needs:     Medical: Not on file    Non-medical: Not on file  Tobacco Use  . Smoking status: Former Smoker    Last attempt to quit: 1988    Years since quitting: 31.7  . Smokeless tobacco: Never Used  . Tobacco comment: Quit at least 30 years ago  Substance and Sexual Activity  . Alcohol use: No  . Drug use: No  . Sexual activity: Not on file  Lifestyle  . Physical activity:    Days per week: Not on file    Minutes per session: Not on file  . Stress: Not on file  Relationships  . Social connections:    Talks on phone: Not on file    Gets together: Not on file    Attends religious service: Not on file    Active member of club or organization: Not on file    Attends meetings of clubs or organizations: Not on file    Relationship status: Not on file  . Intimate partner violence:    Fear of current or ex partner: Not on file    Emotionally abused: Not on file    Physically abused: Not on file    Forced sexual activity: Not on file  Other Topics Concern  . Not on file  Social History Narrative   Lives alone, family support   Caffeine use: 1 drink per day   Right handed    Family History  Problem Relation Age of Onset  . CVA Mother 36  . Heart attack  Sister   . Heart failure Father   . Goiter Father   . Chronic Renal Failure Father   . Alzheimer's disease Sister     Past Medical History:  Diagnosis Date  . Diabetes mellitus ([place])   . GERD (gastroesophageal reflux disease)   . Hyperlipidemia   . Overactive bladder   . Rotator cuff tear 2003    Past Surgical History:  Procedure Laterality Date  . COLONOSCOPY  12/11/2011  . DIAGNOSTIC MAMMOGRAM  06/02/2014    Current Outpatient Medications  Medication Sig Dispense Refill  . aspirin 81 MG tablet Take 81 mg by mouth daily.    . Cholecalciferol (VITAMIN D3) 5000 units CAPS Take 1 capsule by mouth daily.    Marland Kitchen donepezil (ARICEPT) 10 MG tablet TAKE 1 TABLET BY MOUTH EVERY NIGHT AT BEDTIME 90 tablet 1  . hydrochlorothiazide  (HYDRODIURIL) 25 MG tablet Take 25 mg by mouth daily.    Marland Kitchen JANUVIA 100 MG tablet Take 1 tablet by mouth daily.  0  . LORazepam (ATIVAN) 0.5 MG tablet Take 1 tablet (0.5 mg total) by mouth every 6 (six) hours as needed for anxiety. May also take for agitation. Watch for sedation. 30 tablet 2  . Omega-3 Fatty Acids (FISH OIL PO) Take 300 mg by mouth daily.    . potassium chloride (KLOR-CON) 20 MEQ packet Take 20 mEq by mouth 2 (two) times daily.    . sertraline (ZOLOFT) 25 MG tablet Take 1 tablet (25 mg total) by mouth daily. 30 tablet 11  . QUEtiapine (SEROQUEL) 25 MG tablet Take 1 tablet (25 mg total) by mouth at bedtime. 30 tablet 11   No current facility-administered medications for this visit.     Allergies as of 05/24/2018  . (No Known Allergies)    Vitals: BP (!) 157/68   Pulse 79   Ht 5\' 4"  (1.626 m)   Wt 165 lb 6.4 oz (75 kg)   BMI 28.39 kg/m  Last Weight:  Wt Readings from Last 1 Encounters:  05/24/18 165 lb 6.4 oz (75 kg)   Last Height:   Ht Readings from Last 1 Encounters:  05/24/18 5\' 4"  (1.626 m)   MMSE - Mini Mental State Exam 05/24/2018 05/20/2017 03/14/2016  Not completed: (No Data) - -  Orientation to time 1 3 3   Orientation to Place 4 5 4   Registration 3 3 3   Attention/ Calculation 0 2 5  Recall 1 1 1   Language- name 2 objects 2 2 2   Language- repeat 1 0 1  Language- follow 3 step command 3 3 3   Language- read & follow direction 1 1 1   Write a sentence 1 1 1   Copy design 0 1 1  Total score 17 22 25     Physical exam: Exam: Gen: NAD, pleasant                   CV: RRR, no MRG. No Carotid Bruits. No peripheral edema, warm, nontender Eyes: Conjunctivae clear without exudates or hemorrhage  Neuro: Detailed Neurologic Exam  Cranial Nerves:    The pupils are equal, round, and reactive to light. Visual fields are full to finger confrontation. Extraocular movements are intact. Trigeminal sensation is intact and the muscles of mastication are normal. The  face is symmetric. The palate elevates in the midline. Hearing intact. Voice is normal. Shoulder shrug is normal. The tongue has normal motion without fasciculations.   Motor Observation:    No asymmetry, no atrophy, and no involuntary  movements noted. Tone:    Normal muscle tone.    Posture:    Posture is normal. normal erect     Assessment/plan: 83 year old female here for f/u of Alzheimer's dementia. Formal Neurocognitive testing diagnosed Mild dementia most likely secondary to Alzheimer's disease. Here with grandson who is her caretaker.   - Discussed elaying sleeping at night until 11pm. And discussed Seroquel at bedtime.   - Lorazepam as needed acutely for frustration and agitation and times she cannot be redirected. Seroquel at bedtime. Discussed the significant risks of using Benzos and atypical neuroleptics int he elderly, not fda approved, has a black box waribg for morbidity and mortality.  - Start Namenda twice daily after seroquel dose if stable  - Can consider increasing Zoloft in the future for anxiety  - MRI of the brain to eval for dementia, hallucinations reversible causes or intracerebral etiologies: Mild to moderate generalized cortical atrophy that is most pronounced in the mesial temporal lobes c/w Alzheimer's pathology - Formal Neurocognitive testing- Diagnosed with Alzheimer's disease - Labs today- were unremarkable, B12, TSH normal - anxiety- continue Zoloft  Discussed the following again with family as per Dr. Marcia Brash notes:  - discontinue driving - Start Namenda in the future - Recommend more supervision - help with medication management - PoA for healthcare with grandson - Yolanda Bonine is lovely as is patient    Sarina Ill, MD  [facility] [address] [place], [place] [address]  Phone [phone] Fax [phone]  A total of 25 minutes was spent face-to-face with this patient. Over half this time was spent on  counseling patient on the  1. Late onset Alzheimer's disease with behavioral disturbance ([place])   2. Late onset Alzheimer's disease without behavioral disturbance ([place])   3. Dysuria   4. Agitation     and different diagnostic and therapeutic options available.

## 2018-05-24 NOTE — Patient Instructions (Addendum)
- For insomnia, behavioral issues, delusions (people stealing, her home is not hers etc) start Seroquel low dose 25mg  at bedtime. Try to delay bedtime until 11pm. We may need to increase the Seroquel at bedtime or add a dose during the day. Will focus on doing this at this time. - After Seroquel is at a stable dose, may consider starting Namenda (memantine) which is another memory medication. May also increase Zoloft for anxiety, depression and mood.   Quetiapine tablets What is this medicine? QUETIAPINE (kwe TYE a peen) is an antipsychotic. It is used to treat schizophrenia and bipolar disorder, also known as manic-depression. This medicine may be used for other purposes; ask your health care provider or pharmacist if you have questions. COMMON BRAND NAME(S): Seroquel What should I tell my health care provider before I take this medicine? They need to know if you have any of these conditions: -brain tumor or head injury -breast cancer -cataracts -diabetes -difficulty swallowing -heart disease -kidney disease -liver disease -low blood counts, like low white cell, platelet, or red cell counts -low blood pressure or dizziness when standing up -Parkinson's disease -previous heart attack -seizures -suicidal thoughts, plans, or attempt by you or a family member -thyroid disease -an unusual or allergic reaction to quetiapine, other medicines, foods, dyes, or preservatives -pregnant or trying to get pregnant -breast-feeding How should I use this medicine? Take this medicine by mouth. Swallow it with a drink of water. Follow the directions on the prescription label. If it upsets your stomach you can take it with food. Take your medicine at regular intervals. Do not take it more often than directed. Do not stop taking except on the advice of your doctor or health care professional. A special MedGuide will be given to you by the pharmacist with each prescription and refill. Be sure to read this  information carefully each time. Talk to your pediatrician regarding the use of this medicine in children. While this drug may be prescribed for children as young as 10 years for selected conditions, precautions do apply. Patients over age 80 years may have a stronger reaction to this medicine and need smaller doses. Overdosage: If you think you have taken too much of this medicine contact a poison control center or emergency room at once. NOTE: This medicine is only for you. Do not share this medicine with others. What if I miss a dose? If you miss a dose, take it as soon as you can. If it is almost time for your next dose, take only that dose. Do not take double or extra doses. What may interact with this medicine? Do not take this medicine with any of the following medications: -certain medicines for fungal infections like fluconazole, itraconazole, ketoconazole, posaconazole, voriconazole -cisapride -dofetilide -dronedarone -droperidol -grepafloxacin -halofantrine -phenothiazines like chlorpromazine, mesoridazine, thioridazine -pimozide -sparfloxacin -ziprasidone This medicine may also interact with the following medications: -alcohol -antiviral medicines for HIV or AIDS -certain medicines for blood pressure -certain medicines for depression, anxiety, or psychotic disturbances like haloperidol, lorazepam -certain medicines for diabetes -certain medicines for Parkinson's disease -certain medicines for seizures like carbamazepine, phenobarbital, phenytoin -cimetidine -erythromycin -other medicines that prolong the QT interval (cause an abnormal heart rhythm) -rifampin -steroid medicines like prednisone or cortisone This list may not describe all possible interactions. Give your health care provider a list of all the medicines, herbs, non-prescription drugs, or dietary supplements you use. Also tell them if you smoke, drink alcohol, or use illegal drugs. Some items may interact with  your medicine. What should I watch for while using this medicine? Visit your doctor or health care professional for regular checks on your progress. It may be several weeks before you see the full effects of this medicine. Your health care provider may suggest that you have your eyes examined prior to starting this medicine, and every 6 months thereafter. If you have been taking this medicine regularly for some time, do not suddenly stop taking it. You must gradually reduce the dose or your symptoms may get worse. Ask your doctor or health care professional for advice. Patients and their families should watch out for worsening depression or thoughts of suicide. Also watch out for sudden or severe changes in feelings such as feeling anxious, agitated, panicky, irritable, hostile, aggressive, impulsive, severely restless, overly excited and hyperactive, or not being able to sleep. If this happens, especially at the beginning of antidepressant treatment or after a change in dose, call your health care professional. Dennis Bast may get dizzy or drowsy. Do not drive, use machinery, or do anything that needs mental alertness until you know how this medicine affects you. Do not stand or sit up quickly, especially if you are an older patient. This reduces the risk of dizzy or fainting spells. Alcohol can increase dizziness and drowsiness. Avoid alcoholic drinks. Do not treat yourself for colds, diarrhea or allergies. Ask your doctor or health care professional for advice, some ingredients may increase possible side effects. This medicine can reduce the response of your body to heat or cold. Dress warm in cold weather and stay hydrated in hot weather. If possible, avoid extreme temperatures like saunas, hot tubs, very hot or cold showers, or activities that can cause dehydration such as vigorous exercise. What side effects may I notice from receiving this medicine? Side effects that you should report to your doctor or  health care professional as soon as possible: -allergic reactions like skin rash, itching or hives, swelling of the face, lips, or tongue -difficulty swallowing -fast or irregular heartbeat -fever or chills, sore throat -fever with rash, swollen lymph nodes, or swelling of the face -increased hunger or thirst -increased urination -problems with balance, talking, walking -seizures -stiff muscles -suicidal thoughts or other mood changes -uncontrollable head, mouth, neck, arm, or leg movements -unusually weak or tired Side effects that usually do not require medical attention (report to your doctor or health care professional if they continue or are bothersome): -change in sex drive or performance -constipation -drowsy or dizzy -dry mouth -stomach upset -weight gain This list may not describe all possible side effects. Call your doctor for medical advice about side effects. You may report side effects to FDA at 1-800-FDA-1088. Where should I keep my medicine? Keep out of the reach of children. Store at room temperature between 15 and 30 degrees C (59 and 86 degrees F). Throw away any unused medicine after the expiration date. NOTE: This sheet is a summary. It may not cover all possible information. If you have questions about this medicine, talk to your doctor, pharmacist, or health care provider.  2018 Elsevier/Gold Standard (2015-02-06 13:07:35)  Memantine Tablets What is this medicine? MEMANTINE (MEM an teen) is used to treat dementia caused by Alzheimer's disease. This medicine may be used for other purposes; ask your health care provider or pharmacist if you have questions. COMMON BRAND NAME(S): Namenda What should I tell my health care provider before I take this medicine? They need to know if you have any of these conditions: -difficulty passing  urine -kidney disease -liver disease -seizures -an unusual or allergic reaction to memantine, other medicines, foods, dyes, or  preservatives -pregnant or trying to get pregnant -breast-feeding How should I use this medicine? Take this medicine by mouth with a glass of water. Follow the directions on the prescription label. You may take this medicine with or without food. Take your doses at regular intervals. Do not take your medicine more often than directed. Continue to take your medicine even if you feel better. Do not stop taking except on the advice of your doctor or health care professional. Talk to your pediatrician regarding the use of this medicine in children. Special care may be needed. Overdosage: If you think you have taken too much of this medicine contact a poison control center or emergency room at once. NOTE: This medicine is only for you. Do not share this medicine with others. What if I miss a dose? If you miss a dose, take it as soon as you can. If it is almost time for your next dose, take only that dose. Do not take double or extra doses. If you do not take your medicine for several days, contact your health care provider. Your dose may need to be changed. What may interact with this medicine? -acetazolamide -amantadine -cimetidine -dextromethorphan -dofetilide -hydrochlorothiazide -ketamine -metformin -methazolamide -quinidine -ranitidine -sodium bicarbonate -triamterene This list may not describe all possible interactions. Give your health care provider a list of all the medicines, herbs, non-prescription drugs, or dietary supplements you use. Also tell them if you smoke, drink alcohol, or use illegal drugs. Some items may interact with your medicine. What should I watch for while using this medicine? Visit your doctor or health care professional for regular checks on your progress. Check with your doctor or health care professional if there is no improvement in your symptoms or if they get worse. You may get drowsy or dizzy. Do not drive, use machinery, or do anything that needs mental  alertness until you know how this drug affects you. Do not stand or sit up quickly, especially if you are an older patient. This reduces the risk of dizzy or fainting spells. Alcohol can make you more drowsy and dizzy. Avoid alcoholic drinks. What side effects may I notice from receiving this medicine? Side effects that you should report to your doctor or health care professional as soon as possible: -allergic reactions like skin rash, itching or hives, swelling of the face, lips, or tongue -agitation or a feeling of restlessness -depressed mood -dizziness -hallucinations -redness, blistering, peeling or loosening of the skin, including inside the mouth -seizures -vomiting Side effects that usually do not require medical attention (report to your doctor or health care professional if they continue or are bothersome): -constipation -diarrhea -headache -nausea -trouble sleeping This list may not describe all possible side effects. Call your doctor for medical advice about side effects. You may report side effects to FDA at 1-800-FDA-1088. Where should I keep my medicine? Keep out of the reach of children. Store at room temperature between 15 degrees and 30 degrees C (59 degrees and 86 degrees F). Throw away any unused medicine after the expiration date. NOTE: This sheet is a summary. It may not cover all possible information. If you have questions about this medicine, talk to your doctor, pharmacist, or health care provider.  2018 Elsevier/Gold Standard (2013-05-23 14:10:42)    Alzheimer Disease Caregiver Guide A person who has Alzheimer disease may not be able to take care of himself  or herself. He or she may need help with simple tasks. The tips below can help you care for the person. Memory loss and confusion If the person is confused or cannot remember things:  Stay calm.  Respond with a short answer.  Avoid correcting him or her in a way that sounds like scolding.  Try not to  take it personally, even if he or she forgets your name.  Behavior changes The person may go through behavior changes. This can include depression, anxiety, anger, or seeing things that are not there. When behavior changes:  Try not to take behavior changes personally.  Stay calm and patient.  Do not argue or try to convince the person about a specific point.  Know that these changes are part of the disease process. Try to work through it.  Tips to lessen frustration  Make appointments and do daily tasks when the person is at his or her best.  Take your time. Simple tasks may take longer. Allow plenty of time to complete tasks.  Limit choices for the person.  Involve the person in what you are doing.  Stick to a routine.  Avoid new or crowded places, if possible.  Use simple words, short sentences, and a calm voice. Only give 1 direction at a time.  Buy clothes and shoes that are easy to put on and take off.  Let people help if they offer. Home safety  Keep floors clear. Remove rugs, magazine racks, and floor lamps.  Keep hallways well lit.  Put a handrail and nonslip mat in the bathtub or shower.  Put childproof locks on cabinets that have dangerous items in them. These items include medicine, alcohol, guns, toxic cleaning items, sharp tools, matches, or lighters.  Place locks on doors where the person cannot see or reach them. This helps the person to not wander out of the house and get lost.  Be prepared for emergencies. Keep a list of emergency phone numbers and addresses in a handy area. Plans for the future  Talk about finances. ? Talk about money management. People with Alzheimer disease have trouble managing their money as the disease gets worse. ? Get help from professional advisors about financial and legal matters.  Talk about future care. ? Choose a power of attorney. This is someone who can make decisions for the person with Alzheimer disease when he  or she can no longer do so. ? Talk about driving and when it is the right time to stop. The person's doctor can help with this. ? If the person lives alone, make sure he or she is safe. Some people need extra help at home. Other people need more care at a nursing home or care center. Support groups Some benefits of joining a support group include:  Learning ways to manage stress.  Sharing experiences with others.  Getting emotional comfort and support.  Learning new caregiving skills as the disease progresses.  Knowing what community resources are available and taking advantage of them.  Get help if:  The person has a fever.  The person has a sudden behavior change that does not get better with calming strategies.  The person is unable to manage his or her living situation.  The person threatens you or anyone else, including himself or herself.  You are no longer able to care for the person. This information is not intended to replace advice given to you by your health care provider. Make sure you discuss any questions  you have with your health care provider. Document Released: 10/27/2011 Document Revised: 01/10/2016 Document Reviewed: 09/24/2011 Elsevier Interactive Patient Education  2017 [employer].

## 2018-05-25 LAB — URINALYSIS, ROUTINE W REFLEX MICROSCOPIC
BILIRUBIN UA: NEGATIVE
GLUCOSE, UA: NEGATIVE
Leukocytes, UA: NEGATIVE
NITRITE UA: NEGATIVE
RBC UA: NEGATIVE
Specific Gravity, UA: >=1.03 — AB (ref 1.005–1.030)
UUROB: 1 mg/dL (ref 0.2–1.0)
pH, UA: 5 (ref 5.0–7.5)

## 2018-05-25 LAB — BASIC METABOLIC PANEL
BUN/Creatinine Ratio: 25 (ref 12–28)
BUN: 20 mg/dL (ref 8–27)
CALCIUM: 9.5 mg/dL (ref 8.7–10.3)
CHLORIDE: 105 mmol/L (ref 96–106)
CO2: 24 mmol/L (ref 20–29)
Creatinine, Ser: 0.81 mg/dL (ref 0.57–1.00)
GFR calc Af Amer: 79 mL/min/{1.73_m2} (ref >59)
GFR calc non Af Amer: 69 mL/min/{1.73_m2} (ref >59)
GLUCOSE: 115 mg/dL — AB (ref 65–99)
POTASSIUM: 4.4 mmol/L (ref 3.5–5.2)
Sodium: 144 mmol/L (ref 134–144)

## 2018-05-25 LAB — CBC
Hematocrit: 41.1 % (ref 34.0–46.6)
Hemoglobin: 14.1 g/dL (ref 11.1–15.9)
MCH: 30.2 pg (ref 26.6–33.0)
MCHC: 34.3 g/dL (ref 31.5–35.7)
MCV: 88 fL (ref 79–97)
PLATELETS: 234 10*3/uL (ref 150–450)
RBC: 4.67 x10E6/uL (ref 3.77–5.28)
RDW: 13.2 % (ref 12.3–15.4)
WBC: 7.2 10*3/uL (ref 3.4–10.8)

## 2018-05-25 LAB — MICROSCOPIC EXAMINATION
Bacteria, UA: NONE SEEN
CASTS: NONE SEEN /LPF

## 2018-05-26 LAB — URINE CULTURE

## 2018-06-09 ENCOUNTER — Other Ambulatory Visit: Payer: Self-pay | Admitting: Neurology

## 2018-06-09 MED ORDER — QUETIAPINE FUMARATE 50 MG PO TABS: 50.0000 mg | ORAL_TABLET | Freq: Every day | ORAL | 6 refills | Status: DC

## 2018-07-07 ENCOUNTER — Other Ambulatory Visit: Payer: Self-pay | Admitting: Neurology

## 2018-07-07 DIAGNOSIS — G301 Alzheimer's disease with late onset: Principal | ICD-10-CM

## 2018-07-07 DIAGNOSIS — F0280 Dementia in other diseases classified elsewhere without behavioral disturbance: Secondary | ICD-10-CM

## 2018-10-03 ENCOUNTER — Other Ambulatory Visit: Payer: Self-pay | Admitting: Neurology

## 2018-10-03 DIAGNOSIS — F0280 Dementia in other diseases classified elsewhere without behavioral disturbance: Secondary | ICD-10-CM

## 2018-10-03 DIAGNOSIS — G301 Alzheimer's disease with late onset: Principal | ICD-10-CM

## 2018-10-26 DIAGNOSIS — N390 Urinary tract infection, site not specified: Secondary | ICD-10-CM | POA: Diagnosis not present

## 2018-10-27 NOTE — Telephone Encounter (Signed)
Spoke with [name], pt's grandson. Unable to come to appt this week. Scheduled pt for afternoon appt on Tues 11/02/2018 @ 3:00 arrival 2:30 pm. He verbalized appreciation.

## 2018-11-01 ENCOUNTER — Other Ambulatory Visit: Payer: Self-pay | Admitting: Neurology

## 2018-11-01 ENCOUNTER — Other Ambulatory Visit: Payer: Self-pay | Admitting: Family Medicine

## 2018-11-01 DIAGNOSIS — G301 Alzheimer's disease with late onset: Principal | ICD-10-CM

## 2018-11-01 DIAGNOSIS — F0280 Dementia in other diseases classified elsewhere without behavioral disturbance: Secondary | ICD-10-CM

## 2018-11-01 MED ORDER — MEMANTINE HCL 5 MG PO TABS: 5.0000 mg | ORAL_TABLET | Freq: Two times a day (BID) | ORAL | 3 refills | Status: DC

## 2018-11-01 MED ORDER — SERTRALINE HCL 25 MG PO TABS: 25.0000 mg | ORAL_TABLET | Freq: Every day | ORAL | 3 refills | Status: DC

## 2018-11-01 MED ORDER — DONEPEZIL HCL 10 MG PO TABS: 10.0000 mg | ORAL_TABLET | Freq: Every day | ORAL | 3 refills | Status: DC

## 2018-11-01 NOTE — Progress Notes (Signed)
I have spoken with Nichole Diaz, patient's grandson, regarding concerns of coronavirus. I agree that she would be best managed remotely at this time. He expresses concerns of increased agitation throughout the day, specifically the evenings. She is taking Seroquel 50mg  at night, Aricept 10mg  at night and ativan as needed. We have discussed options for treatment. He is comfortable with starting Namenda 5mg  twice daily with plans to increase to 10mg  twice daily as tolerated. We will continue Aricpet and Seroquel at this time. He was reassured that he can use Ativan as needed but to avoid regular use if possible. We will keep in touch every couple of days via Mychart and decide how to proceed. Medications were called to [facility] in [facility] Nichole Diaz expressed understanding and appreciation of call. We will follow up closely with Dr Nichole Diaz as well. Patient has appt scheduled for follow up on 12/02/2018.

## 2018-11-02 ENCOUNTER — Ambulatory Visit: Payer: Self-pay | Admitting: Family Medicine

## 2018-11-02 NOTE — Telephone Encounter (Signed)
Pt due for refill per [place] registry. Last Ativan refill 09/20/2018.

## 2018-11-03 ENCOUNTER — Other Ambulatory Visit: Payer: Self-pay | Admitting: Neurology

## 2018-11-03 MED ORDER — QUETIAPINE FUMARATE 50 MG PO TABS: 50.0000 mg | ORAL_TABLET | Freq: Two times a day (BID) | ORAL | 6 refills | Status: DC

## 2018-11-13 ENCOUNTER — Other Ambulatory Visit: Payer: Self-pay | Admitting: Neurology

## 2018-11-13 MED ORDER — LORAZEPAM 0.5 MG PO TABS: ORAL_TABLET | ORAL | 4 refills | Status: DC

## 2018-12-01 ENCOUNTER — Telehealth: Payer: Self-pay | Admitting: *Deleted

## 2018-12-01 ENCOUNTER — Encounter: Payer: Self-pay | Admitting: *Deleted

## 2018-12-01 NOTE — Telephone Encounter (Signed)
Spoke with grandson [name] (on [place]). He provided updates to pt's chart in preparation for phone visit with [name] tomorrow 4/16 @ 2:00 pm.

## 2018-12-01 NOTE — Telephone Encounter (Signed)
Spoke with pt's grandson [employer]. He gave consent for phone visit tomorrow with [name] and file with insurance. Scheduled for 2 pm. He stated the Ativan may help but not long. Taking 4 times a day. Said the Seroquel has "definitely made a difference." However when she has her episodes they are more severe. Happening about 3-4 times a week. He is aware to be on the lookout for a call from [name] tomorrow around 2 pm. He will try to have pt nearby however is concerned about disruption of her routine. He has been seeking information about placement and has been offered to setup tours. Pt has 24/7 care right now at her home.

## 2018-12-02 ENCOUNTER — Ambulatory Visit (INDEPENDENT_AMBULATORY_CARE_PROVIDER_SITE_OTHER): Payer: PPO | Admitting: Neurology

## 2018-12-02 ENCOUNTER — Other Ambulatory Visit: Payer: Self-pay

## 2018-12-02 ENCOUNTER — Ambulatory Visit: Payer: PPO | Admitting: Neurology

## 2018-12-02 DIAGNOSIS — F0281 Dementia in other diseases classified elsewhere with behavioral disturbance: Secondary | ICD-10-CM | POA: Diagnosis not present

## 2018-12-02 DIAGNOSIS — F02818 Dementia in other diseases classified elsewhere, unspecified severity, with other behavioral disturbance: Secondary | ICD-10-CM

## 2018-12-02 DIAGNOSIS — G301 Alzheimer's disease with late onset: Secondary | ICD-10-CM

## 2018-12-02 MED ORDER — SERTRALINE HCL 50 MG PO TABS: 50.0000 mg | ORAL_TABLET | Freq: Every day | ORAL | 4 refills | Status: DC

## 2018-12-02 MED ORDER — QUETIAPINE FUMARATE 50 MG PO TABS: ORAL_TABLET | ORAL | 6 refills | Status: DC

## 2018-12-02 NOTE — Progress Notes (Signed)
[facility]    Provider:  Dr Nichole Diaz Referring Provider: Osborne Casco Fransico Him, MD Primary Care Physician:  Nichole Pao, MD CC:  Alzheimers  Virtual Visit via Telephone Note  I connected with Nichole Diaz and her grandson (her caretaker and POA) on 12/07/18 at  2:00 PM EDT by telephone and verified that I am speaking with the correct person using two identifiers.   I discussed the limitations, risks, security and privacy concerns of performing an evaluation and management service by telephone and the availability of in person appointments. I also discussed with the patient that there may be a patient responsible charge related to this service. The patient expressed understanding and agreed to proceed. Patient and grandson in their home, physician in home office.  Interval history 12/02/2018: There are more behavior changes. The extra dose of seroquel worked. She keeps insisting she is not in her house. She is very strong. The seroquel helped with her trying to leave, it was not so incessant. We had a long discussion about treating behavior problems, discussed non-pharmalogical and pharmalogical. Grandson plans on a memory unit when covid19 threat is reduced.   Interval history 05/24/2018: She is not sleeping well. She is here with her grandson. She is having behavioral problems. She is having delusions like her son is Risk analyst out. She doesn't feel like her house is hers. She sleeps a few hours and then wakes at 1am - 7am. She does not nap during the day. She gets into bed at 9pm and sleeps only 4 hours. Discussed elaying sleeping at night until 11pm. And discussed Seroquel. Lorazepam as needed acutely for frustration and agitation and times she cannot be redirected. Discussed the significant risks of using Benzos and atypical neuroleptics int he elderly, not fda approved, has a black box waribg for morbidity and mortality.  Interval history 10/06/2017: Discussed  Alzheimer's disease with patient and grandson. She has anxiety, worsening.   personally reviewed MRI brain images and discussed with family: This MRI of the brain without contrast shows the following: 1.    Mild to moderate generalized cortical atrophy that is most pronounced in the mesial temporal lobes. This has progressed when compared to the 09/15/2009 MRI. Corpus callosal atrophy is also noted. 2.    Mild chronic medical vessel ischemic change, mildly progressed when compared to the previous MRI. 3.    There were no acute findings.  Interval history 05/20/2017: Patient was seen in 2017. She did not follow through on the MRI of the brain or the formal neurocognitive testing. She is here with her grandson. Family is concerned. Patient is seeing people ont he roof, and people working in the trees in the back yard. No one else is seeing this. She pays the bills and only one bill has been missed, she is driving and no accidents, grandson says she seems normal to Diaz he is unclear on memory changes.He is there weekly. Hallucinations started in the last year. No other changes or illnesses. Slowly progressive.   HPI:  Nichole Diaz is a 84 y.o. female here as a referral from Dr. Osborne Casco for short-term memory loss. Past medical history of diabetes, hyperlipidemia, GERD. No alcohol use or history of smoking. Memory has been worsening over the past several months. She is here with her neighbor who also provides information.She is having difficulty working, doing book work, Her friend is here and provides information, for years patient has been able to open the safe and has been  having difficulty opening the safe. She is getting invoicing mixed up. She loses things like her keys which is new. She lives alone, no accidents in the home, not late on bills at home, she is having difficulty getting into the computer and bank statements. Had to give up her work at [employer] because she couldn't do it anymore. Mother  had dementia in her 62s dies in early 41s. No accidents, not getting lost. She procrastinates. No mood disorders. Noticed changes over a year ago, slowly progressive with recent worsening over the last 2 years. Sister with Alzheimers. No other focal neurologic deficits. No other associated symptoms. No inciting events or head trauma.  Reviewed notes, labs and imaging from outside physicians, which showed:  TSH, B12, CMP, CBC and RPR were ordered per Dr. Osborne Casco. Hemoglobin A1c 7.3. BUN 16, creatinine 1.1, TSH 3. I don't have results of B12, RPR will request this from Dr. Osborne Casco.   MRI of the brain in January 2011: Personally reviewed and agree with the following.  Findings: There is no evidence for acute infarction, intracranial hemorrhage, mass lesion, hydrocephalus, or extra-axial fluid. There is mild age appropriate atrophy. Mild chronic microvascular ischemic change can be seen in the periventricular and subcortical white matter. The brainstem and cerebellum are relatively spared by the chronic microvascular ischemic change. Sagittal and coronal images demonstrate the cerebellar vermis, cerebellar hemispheres, and cerebellar tonsils all to be within normal limits. No brainstem abnormalities are seen. Mild pannus surrounds the the odontoid, but there is no significant cervicomedullary compression. Pituitary gland unremarkable. Calvarium grossly intact. No significant sinus or mastoid disease.  IMPRESSION: Mild atrophy and small vessel disease. No acute intracranial findings. Good agreement with prior CT. No evidence for posterior fossa ischemia.  MRA HEAD  Findings: Widely patent carotid and basilar arteries. Vertebrals codominant. Posterior cerebral arteries widely patent, as are the other visualized posterior fossa cerebellar branches. Middle cerebral artery segments widely patent. Focal narrowing of the right anterior cerebral artery between A1 and A2  segments estimated be 50%, not felt to be flow reducing. No intracranial aneurysm. Small infundibulum projecting from the left posterior communicating artery origin is identified.  IMPRESSION: No evidence for vertebral basilar insufficiency. Mild intracranial atherosclerotic change affecting the right anterior cerebral artery, not felt to be clinically significant. No visible carotid stenosis or irregularity.  Review of Systems: Patient complains of symptoms per HPI as well as the following symptoms: hallucinations, memory changes. Pertinent negatives and positives per HPI. All others negative.   Social History   Socioeconomic History   Marital status: Widowed    Spouse name: Rush Landmark   Number of children: 2   Years of education: 12   Highest education level: High school graduate  Occupational History   Occupation: homemaker- retired  Scientist, product/process development strain: Not on Training and development officer insecurity:    Worry: Not on file    Inability: Not on Lexicographer needs:    Medical: Not on file    Non-medical: Not on file  Tobacco Use   Smoking status: Former Smoker    Last attempt to quit: 1988    Years since quitting: 32.3   Smokeless tobacco: Never Used   Tobacco comment: Quit at least 30 years ago  Substance and Sexual Activity   Alcohol use: No   Drug use: No   Sexual activity: Not on file  Lifestyle   Physical activity:    Days per week: Not on file  Minutes per session: Not on file   Stress: Not on file  Relationships   Social connections:    Talks on phone: Not on file    Gets together: Not on file    Attends religious service: Not on file    Active member of club or organization: Not on file    Attends meetings of clubs or organizations: Not on file    Relationship status: Not on file   Intimate partner violence:    Fear of current or ex partner: Not on file    Emotionally abused: Not on file    Physically abused: Not on  file    Forced sexual activity: Not on file  Other Topics Concern   Not on file  Social History Narrative   Lives alone, family support and caregivers. Someone is with her 24/7   Caffeine use: 1 drink per day   Right handed    Family History  Problem Relation Age of Onset   CVA Mother 47   Heart attack Sister    Heart failure Father    Goiter Father    Chronic Renal Failure Father    Alzheimer's disease Sister     Past Medical History:  Diagnosis Date   Diabetes mellitus ([place])    GERD (gastroesophageal reflux disease)    Hyperlipidemia    Overactive bladder    Rotator cuff tear 2003    Past Surgical History:  Procedure Laterality Date   COLONOSCOPY  12/11/2011   DIAGNOSTIC MAMMOGRAM  06/02/2014    Current Outpatient Medications  Medication Sig Dispense Refill   aspirin 81 MG tablet Take 81 mg by mouth daily.     Cholecalciferol (VITAMIN D3) 5000 units CAPS Take 1 capsule by mouth daily.     donepezil (ARICEPT) 10 MG tablet Take 1 tablet (10 mg total) by mouth at bedtime. 90 tablet 3   hydrochlorothiazide (HYDRODIURIL) 25 MG tablet Take 25 mg by mouth daily.     JANUVIA 100 MG tablet Take 1 tablet by mouth daily.  0   LORazepam (ATIVAN) 0.5 MG tablet TAKE 1-2 TABLETS BY MOUTH EVERY 4-6 HOURS FOR ANXIETY, AGITATION. WATCH FOR SEDATION AND FALLS. 180 tablet 4   Omega-3 Fatty Acids (FISH OIL PO) Take 300 mg by mouth daily.     potassium chloride (KLOR-CON) 20 MEQ packet Take 20 mEq by mouth 2 (two) times daily.     QUEtiapine (SEROQUEL) 50 MG tablet Take 2 pills (100mg ) in the morning and 2 pills in the evening. Can increase by 50mg (1 pill) a day until patient is at 200mg  twice daily. 240 tablet 6   sertraline (ZOLOFT) 50 MG tablet Take 1 tablet (50 mg total) by mouth daily. 90 tablet 4   No current facility-administered medications for this visit.     Allergies as of 12/02/2018   (No Known Allergies)    Vitals: There were no vitals taken  for this visit. Last Weight:  Wt Readings from Last 1 Encounters:  05/24/18 165 lb 6.4 oz (75 kg)   Last Height:   Ht Readings from Last 1 Encounters:  05/24/18 5\' 4"  (1.626 m)   MMSE - Mini Mental State Exam 05/24/2018 05/20/2017 03/14/2016  Not completed: (No Data) - -  Orientation to time 1 3 3   Orientation to Place 4 5 4   Registration 3 3 3   Attention/ Calculation 0 2 5  Recall 1 1 1   Language- name 2 objects 2 2 2   Language- repeat 1 0 1  Language- follow 3 step command 3 3 3   Language- read & follow direction 1 1 1   Write a sentence 1 1 1   Copy design 0 1 1  Total score 17 22 25    PRIOR EXAM:  Physical exam: Exam: Gen: NAD, pleasant                   CV: RRR, no MRG. No Carotid Bruits. No peripheral edema, warm, nontender Eyes: Conjunctivae clear without exudates or hemorrhage  Neuro: Detailed Neurologic Exam  Cranial Nerves:    The pupils are equal, round, and reactive to light. Visual fields are full to finger confrontation. Extraocular movements are intact. Trigeminal sensation is intact and the muscles of mastication are normal. The face is symmetric. The palate elevates in the midline. Hearing intact. Voice is normal. Shoulder shrug is normal. The tongue has normal motion without fasciculations.   Motor Observation:    No asymmetry, no atrophy, and no involuntary movements noted. Tone:    Normal muscle tone.    Posture:    Posture is normal. normal erect     Assessment/plan: 84 year old female f/u of Alzheimer's dementia. Worsening behavior problems.  Seroquel: Increase the seroquel to 2 pills (100mg ) twice daily. Based on her response you can increase by one pill every day or every few days until she is at 4pills (200mg ) twice daily. However if you find 100mg  or 150mg  twice daily works well, you don't have to increase it further at this time. You can also spread the medication out to 3x a day if needed(such as if the morning dose makes her too sleepy) for  example 100mg  morning, 100mg  in the afternoon, and 200mg  at bedtime.   Ativan: Continue to give her 1 pill 3x -4x a day scheduled.If needed give her an additional pill or 2 pills at a time 3-4x a day until needed. As the Seroquel takes effect, start decreasing the Ativan as needed so she is not too sedated or falls or become more confused. Keep the dose to no more than 2mg  at one time or 6mg  total a day(your pills are 0,5mg  so that would be a maximum of 4 pills at once or 12 pills a day). If you need a new prescription with additional pills please let me know.   Zoloft: Increase to 50mg  a day. Our goal may be to get her to 100mg  but let's get the Seroquel titrated first.   These medications can cause sedation and falls and other side effects. Unfortunately, as we discussed, they are often necessary to treat behavior problems in dementia in order to try and keep patient and family safe. We try our best and make the best decisions best we can. I'm always her for you and your family. I called in a new script for the zoloft and seroquel. Dr. Jaynee Diaz   - MRI of the brain to eval for dementia, hallucinations reversible causes or intracerebral etiologies: Mild to moderate generalized cortical atrophy that is most pronounced in the mesial temporal lobes c/w Alzheimer's pathology - Formal Neurocognitive testing- Diagnosed with Alzheimer's disease - Labs- were unremarkable, B12, TSH normal - anxiety/behavior - increase Zoloft  Discussed the following again with family as per Dr. Marcia Brash notes:  - discontinue driving - Did NOT tolerate [place] - Recommend more supervision - help with medication management - PoA for healthcare with grandson - Yolanda Bonine is lovely as is patient  I discussed the assessment and treatment plan with the grandson while patient present.  The patient was provided an opportunity to ask questions and all were answered. The patient agreed with the plan and demonstrated an understanding  of the instructions.   The family was advised to call back or seek an in-person evaluation if the symptoms worsen or if the condition fails to improve as anticipated.  I provided 40 minutes of non-face-to-face time during this encounter.   Melvenia Beam, MD    Sarina Ill, MD  [facility] [address] [place], [place] [address]  Phone [phone] Fax [phone]  A total of 25 minutes was spent face-to-face with this patient. Over half this time was spent on counseling patient on the  1. Alzheimer's dementia, late onset, with behavioral disturbance ([place])   2. Late onset Alzheimer's disease with behavioral disturbance ([place])     and different diagnostic and therapeutic options available.

## 2018-12-07 ENCOUNTER — Encounter: Payer: Self-pay | Admitting: Neurology

## 2018-12-10 ENCOUNTER — Other Ambulatory Visit: Payer: Self-pay

## 2018-12-10 ENCOUNTER — Encounter (HOSPITAL_COMMUNITY): Payer: Self-pay | Admitting: Obstetrics and Gynecology

## 2018-12-10 ENCOUNTER — Emergency Department (HOSPITAL_COMMUNITY)
Admission: EM | Admit: 2018-12-10 | Discharge: 2018-12-10 | Disposition: A | Discharge status: Home / Self Care | Payer: PPO | Attending: Emergency Medicine | Admitting: Emergency Medicine

## 2018-12-10 DIAGNOSIS — Z87891 Personal history of nicotine dependence: Secondary | ICD-10-CM | POA: Diagnosis not present

## 2018-12-10 DIAGNOSIS — E119 Type 2 diabetes mellitus without complications: Secondary | ICD-10-CM | POA: Diagnosis not present

## 2018-12-10 DIAGNOSIS — G309 Alzheimer's disease, unspecified: Secondary | ICD-10-CM | POA: Insufficient documentation

## 2018-12-10 DIAGNOSIS — I959 Hypotension, unspecified: Secondary | ICD-10-CM | POA: Diagnosis not present

## 2018-12-10 DIAGNOSIS — F0391 Unspecified dementia with behavioral disturbance: Secondary | ICD-10-CM | POA: Diagnosis not present

## 2018-12-10 DIAGNOSIS — Z7984 Long term (current) use of oral hypoglycemic drugs: Secondary | ICD-10-CM | POA: Insufficient documentation

## 2018-12-10 DIAGNOSIS — Z79899 Other long term (current) drug therapy: Secondary | ICD-10-CM | POA: Diagnosis not present

## 2018-12-10 DIAGNOSIS — F0281 Dementia in other diseases classified elsewhere with behavioral disturbance: Secondary | ICD-10-CM | POA: Diagnosis not present

## 2018-12-10 DIAGNOSIS — R404 Transient alteration of awareness: Secondary | ICD-10-CM | POA: Diagnosis not present

## 2018-12-10 DIAGNOSIS — R410 Disorientation, unspecified: Secondary | ICD-10-CM

## 2018-12-10 DIAGNOSIS — Z7982 Long term (current) use of aspirin: Secondary | ICD-10-CM | POA: Insufficient documentation

## 2018-12-10 DIAGNOSIS — T68XXXA Hypothermia, initial encounter: Secondary | ICD-10-CM | POA: Diagnosis not present

## 2018-12-10 NOTE — Discharge Instructions (Signed)
Episode today was likely related to dementia continue with following up with [name] for medication adjustments.  If she begins having more confusion than usual, fevers cough, complaining of burning or pain with urination or you notice any unusual behavior please return for reevaluation.

## 2018-12-10 NOTE — ED Provider Notes (Signed)
Nichole Diaz   CSN: [id number] Arrival date & time: 12/10/18  1124    History   Chief Complaint Chief Complaint  Patient presents with  . Cold Exposure    HPI Nichole Diaz is a 84 y.o. female.     Nichole Diaz is a 84 y.o. female with a history of Alzheimer's with behavioral disturbance, diabetes, hyperlipidemia, GERD and overactive bladder, who presents to the emergency department via EMS with exposure.  Patient is cared for by her grandson who woke up this morning to find that the front door of the house was open and the patient was nowhere to be found within the house.  After contacting fire and [place] the patient was found walking through a cornfield.  She reports that she got up this morning he began to snack and must have gotten confused and walked out of the house.  She was found walking around in a cornfield.  She had not fallen down.  When EMS initially found the patient her temp of 97.0 and she had cool extremities and was shivering and they were concerned for possible cold exposure.  Per the patient's grandson who is her primary caretaker she was acting at baseline on scene prior to EMS transporting her. The patient reports that she got a little lost.  She denies falling down.  Denies any pain anywhere.  Specifically denies chest pain, shortness of breath, abdominal pain, headache, vision changes, numbness or weakness, urinary symptoms, fevers or cough.  Further history provided by reviewing patient's medical chart, she is followed by Dr. Lavell Anchors with neurology for her dementia and patient has had increasing issues with behavioral disturbance where she feels that her house is not hers and wants to leave and I suspect this may be what stemmed this behavior today     Past Medical History:  Diagnosis Date  . Alzheimer's dementia with behavioral disturbance ([place])   . Diabetes mellitus ([place])   . GERD (gastroesophageal  reflux disease)   . Hyperlipidemia   . Overactive bladder   . Rotator cuff tear 2003    Patient Active Problem List   Diagnosis Date Noted  . Dementia with behavioral disturbance ([place]) 05/24/2018  . Alzheimer's dementia, late onset, with behavioral disturbance ([place]) 10/06/2017    Past Surgical History:  Procedure Laterality Date  . COLONOSCOPY  12/11/2011  . DIAGNOSTIC MAMMOGRAM  06/02/2014     OB History   No obstetric history on file.      Home Medications    Prior to Admission medications   Medication Sig Start Date End Date Taking? Authorizing Provider  aspirin 81 MG tablet Take 81 mg by mouth daily.   Yes [provider]  Cholecalciferol (VITAMIN D3) 5000 units CAPS Take 1 capsule by mouth daily.   Yes [provider]  donepezil (ARICEPT) 10 MG tablet Take 1 tablet (10 mg total) by mouth at bedtime. 11/01/18  Yes Lomax, Amy, NP  JANUVIA 100 MG tablet Take 100 mg by mouth daily.  03/09/17  Yes [provider]  LORazepam (ATIVAN) 0.5 MG tablet TAKE 1-2 TABLETS BY MOUTH EVERY 4-6 HOURS FOR ANXIETY, AGITATION. WATCH FOR SEDATION AND FALLS. Patient taking differently: Take 0.5 mg by mouth 4 (four) times daily. TAKE 1-2 TABLETS BY MOUTH EVERY 4-6 HOURS FOR ANXIETY, AGITATION. WATCH FOR SEDATION AND FALLS. 11/13/18  Yes Melvenia Beam, MD  Omega-3 Fatty Acids (FISH OIL PO) Take 300 mg by mouth daily.  Yes [provider]  QUEtiapine (SEROQUEL) 50 MG tablet Take 2 pills (100mg ) in the morning and 2 pills in the evening. Can increase by 50mg (1 pill) a day until patient is at 200mg  twice daily. Patient taking differently: Take 200 mg by mouth 2 (two) times daily. Take 2 pills (100mg ) in the morning and 2 pills in the evening. Can increase by 50mg (1 pill) a day until patient is at 200mg  twice daily. 12/02/18  Yes Melvenia Beam, MD  sertraline (ZOLOFT) 50 MG tablet Take 1 tablet (50 mg total) by mouth daily. 12/02/18  Yes Melvenia Beam, MD     Family History Family History  Problem Relation Age of Onset  . CVA Mother 75  . Heart attack Sister   . Heart failure Father   . Goiter Father   . Chronic Renal Failure Father   . Alzheimer's disease Sister     Social History Social History   Tobacco Use  . Smoking status: Former Smoker    Last attempt to quit: 1988    Years since quitting: 32.3  . Smokeless tobacco: Never Used  . Tobacco comment: Quit at least 30 years ago  Substance Use Topics  . Alcohol use: No  . Drug use: No     Allergies   Patient has no known allergies.   Review of Systems Review of Systems  Constitutional: Negative for chills and fever.  HENT: Negative.   Eyes: Negative for visual disturbance.  Respiratory: Negative for cough and shortness of breath.   Cardiovascular: Negative for chest pain.  Gastrointestinal: Negative for abdominal pain, nausea and vomiting.  Genitourinary: Negative for dysuria and frequency.  Musculoskeletal: Negative for arthralgias, back pain, myalgias and neck pain.  Skin: Negative for color change and rash.  Neurological: Negative for dizziness, syncope and light-headedness.  Psychiatric/Behavioral: Positive for confusion. Negative for agitation.     Physical Exam Updated Vital Signs BP (!) 156/57 (BP Location: Right Arm)   Pulse 74   Temp (!) 97.5 F (36.4 C) (Oral)   Resp 17   Ht 5\' 3"  (1.6 m)   SpO2 94%   BMI 29.30 kg/m   Physical Exam Vitals signs and nursing Diaz reviewed.  Constitutional:      General: She is not in acute distress.    Appearance: Normal appearance. She is well-developed and normal weight. She is not ill-appearing or diaphoretic.  HENT:     Head: Normocephalic and atraumatic.     Comments: Scalp appears atraumatic, no lacerations or abrasions, no palpable hematoma, step-off or deformity.    Nose: Nose normal.     Mouth/Throat:     Mouth: Mucous membranes are moist.     Pharynx: Oropharynx is clear.  Eyes:     General:         Right eye: No discharge.        Left eye: No discharge.     Extraocular Movements: Extraocular movements intact.     Pupils: Pupils are equal, round, and reactive to light.  Neck:     Musculoskeletal: Neck supple.     Comments: No C-spine tenderness. Cardiovascular:     Rate and Rhythm: Normal rate and regular rhythm.     Pulses: Normal pulses.          Radial pulses are 2+ on the right side and 2+ on the left side.       Dorsalis pedis pulses are 2+ on the right side and 2+ on the left side.  Heart sounds: Normal heart sounds. No murmur. No friction rub. No gallop.   Pulmonary:     Effort: Pulmonary effort is normal. No respiratory distress.     Breath sounds: Normal breath sounds. No wheezing or rales.     Comments: Respirations equal and unlabored, patient able to speak in full sentences, lungs clear to auscultation bilaterally, no chest wall tenderness Abdominal:     General: Bowel sounds are normal. There is no distension.     Palpations: Abdomen is soft. There is no mass.     Tenderness: There is no abdominal tenderness. There is no guarding.     Comments: Abdomen soft, nondistended, nontender to palpation in all quadrants without guarding or peritoneal signs  Musculoskeletal:        General: No deformity.     Comments: No midline thoracic or lumbar tenderness.  No pelvic tenderness or instability with palpation.  All joints supple and easily movable.  All compartments soft.  Skin:    General: Skin is warm and dry.     Capillary Refill: Capillary refill takes less than 2 seconds.     Findings: No bruising.  Neurological:     Mental Status: She is alert.     Coordination: Coordination normal.     Comments: Speech is clear, able to follow commands CN III-XII intact Normal strength in upper and lower extremities bilaterally including dorsiflexion and plantar flexion, strong and equal grip strength Sensation normal to light and sharp touch Moves extremities without ataxia,  coordination intact  Psychiatric:        Mood and Affect: Mood normal.        Behavior: Behavior normal.      ED Treatments / Results  Labs (all labs ordered are listed, but only abnormal results are displayed) Labs Reviewed - No data to display  EKG None  Radiology No results found.  Procedures Procedures (including critical care time)  Medications Ordered in ED Medications - No data to display   Initial Impression / Assessment and Plan / ED Course  I have reviewed the triage vital signs and the nursing notes.  Pertinent labs & imaging results that were available during my care of the patient were reviewed by me and considered in my medical decision making (see chart for details).  Patient with known history of Alzheimer's with behavioral disturbance, presents via EMS after being found walking through a cornfield after she left her home unexpectedly this morning and got lost.  She did not fall down and has no signs of trauma or injury on exam.  EMS was concerned for possible cold exposure, on arrival she has a temp of 97.5 and all extremities are warm and well-perfused.  She has a normal neurologic exam and at this time is alert and oriented.  She has no signs of trauma or injury.  I have discussed with patient's grandson who is her primary caregiver and he has arrived at the bedside and that she is at her baseline at this usual.  Given his reassuring exam and the fact that the patient is warm and shivering has resolved with warm blankets I feel she is stable for discharge home without further evaluation or intervention.  I discussed this with the grandson and patient and they expressed agreement with this plan.  Patient discharged home in good condition.  Patient discussed with Dr. Tyrone Nine, who saw patient as well and agrees with plan.  Final Clinical Impressions(s) / ED Diagnoses   Final diagnoses:  Transient confusion  Alzheimer's dementia with behavioral disturbance,  unspecified timing of dementia onset [facility])    ED Discharge Orders    None       Jacqlyn Larsen, [place] 12/12/18 [place], DO 12/13/18 1506

## 2018-12-10 NOTE — ED Triage Notes (Signed)
Per EMS: Pt is reportedly coming form a corn field. Pt was last seen last night around 10pm. Pt reports they woke up this morning around 0630 and found the front door open and pt missing.  Fired department and [facility] department conducted and search and found patient in corn field.  Pt reports she got lost.  Pt reports pt is at baseline and has a hx of alzheimers.  Patient was originally 97.0 degrees with EMS with cool extremities.  Patient's shivering has improved per EMS.

## 2018-12-10 NOTE — ED Notes (Signed)
Bed: WA21 Expected date:  Expected time:  Means of arrival:  Comments: EMS-possible hypthermia

## 2018-12-18 ENCOUNTER — Other Ambulatory Visit: Payer: Self-pay | Admitting: Neurology

## 2018-12-18 MED ORDER — QUETIAPINE FUMARATE 100 MG PO TABS: 100.0000 mg | ORAL_TABLET | Freq: Two times a day (BID) | ORAL | 6 refills | Status: DC

## 2018-12-18 NOTE — Progress Notes (Signed)
seroquel

## 2019-03-02 ENCOUNTER — Other Ambulatory Visit: Payer: Self-pay | Admitting: Neurology

## 2019-03-02 MED ORDER — ALPRAZOLAM 0.25 MG PO TABS: 0.2500 mg | ORAL_TABLET | Freq: Three times a day (TID) | ORAL | 2 refills | Status: DC | PRN

## 2019-06-06 ENCOUNTER — Other Ambulatory Visit: Payer: Self-pay | Admitting: Neurology

## 2019-06-08 NOTE — Telephone Encounter (Signed)
Per [place] registry, last refill was 05/06/2019. All prescribed by [name]. Will send refill to her for approval.

## 2019-07-05 ENCOUNTER — Encounter: Payer: Self-pay | Admitting: *Deleted

## 2019-07-05 ENCOUNTER — Other Ambulatory Visit: Payer: Self-pay | Admitting: *Deleted

## 2019-07-05 NOTE — Telephone Encounter (Signed)
We received a refill request for generic Seroquel 100 mg BID. Per most recent mychart message, pt taking 250 mg daily. Will clarify with pt's grandson [name].

## 2019-07-24 ENCOUNTER — Other Ambulatory Visit: Payer: Self-pay | Admitting: Neurology

## 2019-07-25 ENCOUNTER — Other Ambulatory Visit: Payer: Self-pay | Admitting: Neurology

## 2019-07-25 MED ORDER — QUETIAPINE FUMARATE 100 MG PO TABS: 100.0000 mg | ORAL_TABLET | Freq: Two times a day (BID) | ORAL | 4 refills | Status: DC

## 2019-08-23 DIAGNOSIS — R4020 Unspecified coma: Secondary | ICD-10-CM | POA: Diagnosis not present

## 2019-08-23 DIAGNOSIS — R55 Syncope and collapse: Secondary | ICD-10-CM | POA: Diagnosis not present

## 2019-08-23 DIAGNOSIS — I959 Hypotension, unspecified: Secondary | ICD-10-CM | POA: Diagnosis not present

## 2019-08-30 DIAGNOSIS — R82998 Other abnormal findings in urine: Secondary | ICD-10-CM | POA: Diagnosis not present

## 2019-11-09 ENCOUNTER — Other Ambulatory Visit: Payer: Self-pay | Admitting: Neurology

## 2019-12-19 ENCOUNTER — Telehealth: Payer: Self-pay

## 2019-12-19 NOTE — Telephone Encounter (Signed)
RX request for Donepezil was received from Nichole Diaz. I attempted to contact the patient w/o success. --- If the patient's family member [name] (per [facility]) please schedule a f/u appt for mychart visit for more refills.

## 2019-12-20 NOTE — Telephone Encounter (Signed)
Spoke to [employer] re: f/u appt for more refills.  He said he will call back when he figures out his schedule.

## 2020-01-17 ENCOUNTER — Other Ambulatory Visit: Payer: Self-pay | Admitting: Neurology

## 2020-01-17 NOTE — Telephone Encounter (Signed)
Pt grandson called to advise they do not have access to mychart and would like a CB to discuss a possible tele apt. Offered to provide mychart # brandon declined and requested a cb from RN to discuss

## 2020-01-18 ENCOUNTER — Other Ambulatory Visit: Payer: Self-pay | Admitting: Neurology

## 2020-01-18 ENCOUNTER — Other Ambulatory Visit: Payer: Self-pay | Admitting: *Deleted

## 2020-01-18 DIAGNOSIS — F0280 Dementia in other diseases classified elsewhere without behavioral disturbance: Secondary | ICD-10-CM

## 2020-01-18 MED ORDER — DONEPEZIL HCL 10 MG PO TABS: 10.0000 mg | ORAL_TABLET | Freq: Every day | ORAL | 0 refills | Status: DC

## 2020-01-18 NOTE — Telephone Encounter (Signed)
Jael called the pt's grandson to update him and the call was transferred to me. Pt's grandson was made aware that the refills were sent in for now. He was also advised the message about the appt was coming from [name]. Pt's grandson stated the pt has continued to decline and is close to being placed in a home but in the past year hasn't been able to because of COVID. He said the pt doesn't know who he is anymore. She is suspicious of him and others. She has caregivers that are helpful but she has been rough with them. He stated a break in her routine is detrimental. He fears a video visit on a "talking box" may set her back two weeks. I explained to him that a year ago we did a telephone visit because of COVID and that this time [name] needed to physically see her. She also a recent h/o breaking out of the house. He wanted to see what [name] advises given this information and if there's any other way to do the appt. When bringing her in was suggested he said she wasn't even "mobile". He did say if the video visit has to be done he will do it.

## 2020-01-18 NOTE — Telephone Encounter (Signed)
Per [name], pt can see Amy NP. I called [name] and discussed message from [name]. He was appreciative and I scheduled pt with next open afternoon appt of 04/19/20 at 2:30 pm. He will do a mychart video visit. His questions were answered during the call. He verbalized appreciation for the call.

## 2020-01-18 NOTE — Telephone Encounter (Signed)
I just need a brief visual of her, she doesn't even need to know, I really need to lay eyes on her. Then I can talk to him alone, thanks

## 2020-01-18 NOTE — Telephone Encounter (Signed)
Grandson called again wanting to know what the update on this is so that the pt can get her medications.

## 2020-01-18 NOTE — Telephone Encounter (Signed)
Per  [place] registry, last filled on 12/04/2019 Alprazolam 0.25 Mg Tablet #120.

## 2020-01-18 NOTE — Telephone Encounter (Signed)
Spoke with [name]. Telemedicine appt is fine but this must be a virtual visit (not telephone) so the patient can be visualized.

## 2020-02-23 ENCOUNTER — Encounter: Payer: Self-pay | Admitting: Family Medicine

## 2020-02-23 ENCOUNTER — Other Ambulatory Visit: Payer: Self-pay | Admitting: Neurology

## 2020-02-23 DIAGNOSIS — F0280 Dementia in other diseases classified elsewhere without behavioral disturbance: Secondary | ICD-10-CM

## 2020-02-23 MED ORDER — DONEPEZIL HCL 10 MG PO TABS: 10.0000 mg | ORAL_TABLET | Freq: Every day | ORAL | 1 refills | Status: DC

## 2020-03-20 ENCOUNTER — Other Ambulatory Visit: Payer: Self-pay | Admitting: Neurology

## 2020-03-20 DIAGNOSIS — G301 Alzheimer's disease with late onset: Secondary | ICD-10-CM

## 2020-03-20 DIAGNOSIS — F0280 Dementia in other diseases classified elsewhere without behavioral disturbance: Secondary | ICD-10-CM

## 2020-03-21 ENCOUNTER — Other Ambulatory Visit: Payer: Self-pay | Admitting: Neurology

## 2020-03-21 DIAGNOSIS — F0280 Dementia in other diseases classified elsewhere without behavioral disturbance: Secondary | ICD-10-CM

## 2020-04-16 ENCOUNTER — Other Ambulatory Visit: Payer: Self-pay | Admitting: Neurology

## 2020-04-16 DIAGNOSIS — F0280 Dementia in other diseases classified elsewhere without behavioral disturbance: Secondary | ICD-10-CM

## 2020-04-19 ENCOUNTER — Encounter: Payer: Self-pay | Admitting: Family Medicine

## 2020-04-19 ENCOUNTER — Telehealth (INDEPENDENT_AMBULATORY_CARE_PROVIDER_SITE_OTHER): Payer: PPO | Admitting: Family Medicine

## 2020-04-19 DIAGNOSIS — F0280 Dementia in other diseases classified elsewhere without behavioral disturbance: Secondary | ICD-10-CM

## 2020-04-19 DIAGNOSIS — R443 Hallucinations, unspecified: Secondary | ICD-10-CM | POA: Diagnosis not present

## 2020-04-19 DIAGNOSIS — R451 Restlessness and agitation: Secondary | ICD-10-CM

## 2020-04-19 DIAGNOSIS — G301 Alzheimer's disease with late onset: Secondary | ICD-10-CM | POA: Diagnosis not present

## 2020-04-19 NOTE — Progress Notes (Signed)
I agree with the above plan 

## 2020-04-19 NOTE — Progress Notes (Signed)
PATIENT: Nichole Diaz DOB: 1935/04/07  REASON FOR VISIT: follow up HISTORY FROM: patient  Virtual Visit via Telephone Note  I connected with Nichole Diaz on 04/19/20 at  2:30 PM EDT by telephone and verified that I am speaking with the correct person using two identifiers.   I discussed the limitations, risks, security and privacy concerns of performing an evaluation and management service by telephone and the availability of in person appointments. I also discussed with the patient that there may be a patient responsible charge related to this service. The patient expressed understanding and agreed to proceed.   History of Present Illness:  04/19/20 Nichole Diaz is a 85 y.o. female here today for follow up for dementia. Constance Holster and caregiver, assists with history today. He reports that, overall, she is doing fairly well. She continues donepezil 10mg  daily, sertraline 50mg  daily, quetiapine 100mg  twice daily, and  alprozolam 0.25-0.5mg  up to three times daily. [name] reports alprazolam has helped much more than lorazepam. She seems to be tolerating medications well. She continues to have hallucination and delusions but seems better. She has good days and bad days. [name] states that she is very suspicious. Change in care givers can set her off for a week. She has 24/7 care. She continues to live in her home. [name] is POA and has thought about memory care but is getting push back form his family. He feels that he is holding up fairly well. His wife is his support system.   History (copied from Dr Cathren Laine note on 12/02/2018)  Interval history 12/02/2018: There are more behavior changes. The extra dose of seroquel worked. She keeps insisting she is not in her house. She is very strong. The seroquel helped with her trying to leave, it was not so incessant. We had a long discussion about treating behavior problems, discussed non-pharmalogical and pharmalogical. Grandson plans on a  memory unit when covid19 threat is reduced.   Interval history 05/24/2018: She is not sleeping well. She is here with her grandson. She is having behavioral problems. She is having delusions like her son is Risk analyst out. She doesn't feel like her house is hers. She sleeps a few hours and then wakes at 1am - 7am. She does not nap during the day. She gets into bed at 9pm and sleeps only 4 hours. Discussed elaying sleeping at night until 11pm. And discussed Seroquel. Lorazepam as needed acutely for frustration and agitation and times she cannot be redirected. Discussed the significant risks of using Benzos and atypical neuroleptics int he elderly, not fda approved, has a black box waribg for morbidity and mortality.  Interval history 10/06/2017: Discussed Alzheimer's disease with patient and grandson. She has anxiety, worsening.   personally reviewed MRI brain images and discussed with family: This MRI of the brain without contrast shows the following: 1. Mild to moderate generalized cortical atrophy that is most pronounced in the mesial temporal lobes. This has progressed when compared to the 09/15/2009 MRI. Corpus callosal atrophy is also noted. 2. Mild chronic medical vessel ischemic change, mildly progressed when compared to the previous MRI. 3. There were no acute findings.  Interval history 05/20/2017: Patient was seen in 2017. She did not follow through on the MRI of the brain or the formal neurocognitive testing. She is here with her grandson. Family is concerned. Patient is seeing people ont he roof, and people working in the trees in the back yard. No one else is seeing this. She  pays the bills and only one bill has been missed, she is driving and no accidents, grandson says she seems normal to him he is unclear on memory changes.He is there weekly. Hallucinations started in the last year. No other changes or illnesses. Slowly progressive.   KYH:CWCBJ J Akeris a 85  y.o.femalehere as a referral from Dr. Lenor Derrick short-term memory loss. Past medical history of diabetes, hyperlipidemia, GERD. No alcohol use or history of smoking. Memory has been worsening over the past several months. She is here with her neighbor who also provides information.She is having difficulty working, doing book work, Her friend is here and provides information, for years patient has been able to open the safe and has been having difficulty opening the safe. She is getting invoicing mixed up. She loses things like her keys which is new. She lives alone, no accidents in the home, not late on bills at home, she is having difficulty getting into the computer and bank statements. Had to give up her work at [employer] because she couldn't do it anymore. Mother had dementia in her 55s dies in early 35s. No accidents, not getting lost. She procrastinates. No mood disorders. Noticed changes over a year ago, slowly progressive with recent worsening over the last 2 years. Sister with Alzheimers. No other focal neurologic deficits. No other associated symptoms. No inciting events or head trauma.  Reviewed notes, labs and imaging from outside physicians, which showed:  TSH, B12, CMP, CBC and RPR were ordered per Dr. Osborne Casco. Hemoglobin A1c 7.3. BUN 16, creatinine 1.1, TSH 3. I don't have results of B12, RPR will request this from Dr. Osborne Casco.   MRI of the brain in January 2011: Personally reviewed and agree with the following.  Findings: There is no evidence for acute infarction, intracranial hemorrhage, mass lesion, hydrocephalus, or extra-axial fluid. There is mild age appropriate atrophy. Mild chronic microvascular ischemic change can be seen in the periventricular and subcortical white matter. The brainstem and cerebellum are relatively spared by the chronic microvascular ischemic change. Sagittal and coronal images demonstrate the cerebellar vermis, cerebellar hemispheres, and  cerebellar tonsils all to be within normal limits. No brainstem abnormalities are seen. Mild pannus surrounds the the odontoid, but there is no significant cervicomedullary compression. Pituitary gland unremarkable. Calvarium grossly intact. No significant sinus or mastoid disease.  IMPRESSION: Mild atrophy and small vessel disease. No acute intracranial findings. Good agreement with prior CT. No evidence for posterior fossa ischemia.  MRA HEAD  Findings: Widely patent carotid and basilar arteries. Vertebrals codominant. Posterior cerebral arteries widely patent, as are the other visualized posterior fossa cerebellar branches. Middle cerebral artery segments widely patent. Focal narrowing of the right anterior cerebral artery between A1 and A2 segments estimated be 50%, not felt to be flow reducing. No intracranial aneurysm. Small infundibulum projecting from the left posterior communicating artery origin is identified.  IMPRESSION: No evidence for vertebral basilar insufficiency. Mild intracranial atherosclerotic change affecting the right anterior cerebral artery, not felt to be clinically significant. No visible carotid stenosis or irregularity.    Observations/Objective:  Generalized: Well developed, in no acute distress, sitting comfortably in her chair at home.  Mentation: Alert, not oriented. Speech and language fluent   Assessment and Plan:  85 y.o. year old female  has a past medical history of Alzheimer's dementia with behavioral disturbance ([place]), Diabetes mellitus ([place]), GERD (gastroesophageal reflux disease), Hyperlipidemia, Overactive bladder, and Rotator cuff tear (2003). here with    ICD-10-CM   1. Late onset  Alzheimer's disease without behavioral disturbance (HCC)  G30.1    F02.80   2. Agitation  R45.1   3. Hallucination  R44.3    Tiarah is doing fairly well, today. We will continue current treatment plan. [name] is aware of higher  medication doses and need for close monitoring. Patient seems to be tolerating well with no obvious adverse effects. No injuries or falls. She will continue 24/7 care giver. She does not drive. She will continue close follow up with PCP. [name] was encouraged to call us with any questions or concerns.    No orders of the defined types were placed in this encounter.   No orders of the defined types were placed in this encounter.    Follow Up Instructions:  I discussed the assessment and treatment plan with the patient. The patient was provided an opportunity to ask questions and all were answered. The patient agreed with the plan and demonstrated an understanding of the instructions.   The patient was advised to call back or seek an in-person evaluation if the symptoms worsen or if the condition fails to improve as anticipated.  I provided 20 minutes of non-face-to-face time during this encounter. Patient is located at her place of residence during [place] visit. [name], grandson, assists with visit. Provider is in the office.    Debbora Presto, NP

## 2020-04-30 ENCOUNTER — Other Ambulatory Visit: Payer: Self-pay | Admitting: Neurology

## 2020-04-30 DIAGNOSIS — F02818 Dementia in other diseases classified elsewhere, unspecified severity, with other behavioral disturbance: Secondary | ICD-10-CM

## 2020-05-13 ENCOUNTER — Other Ambulatory Visit: Payer: Self-pay | Admitting: Neurology

## 2020-05-13 DIAGNOSIS — F0280 Dementia in other diseases classified elsewhere without behavioral disturbance: Secondary | ICD-10-CM

## 2020-05-17 ENCOUNTER — Other Ambulatory Visit: Payer: Self-pay | Admitting: Neurology

## 2020-05-17 DIAGNOSIS — F0280 Dementia in other diseases classified elsewhere without behavioral disturbance: Secondary | ICD-10-CM

## 2020-06-18 ENCOUNTER — Other Ambulatory Visit: Payer: Self-pay | Admitting: Neurology

## 2020-06-18 ENCOUNTER — Other Ambulatory Visit: Payer: Self-pay | Admitting: *Deleted

## 2020-06-18 NOTE — Telephone Encounter (Signed)
Per [place] registry, last filled on  05/15/2020 Alprazolam 0.25 Mg Tablet #120.00 for 20 day supply. Will send Rx to Dr [name] to e-scribe.

## 2020-10-16 ENCOUNTER — Other Ambulatory Visit: Payer: Self-pay | Admitting: Neurology

## 2020-12-20 ENCOUNTER — Emergency Department (HOSPITAL_COMMUNITY): Payer: PPO

## 2020-12-20 ENCOUNTER — Emergency Department (HOSPITAL_COMMUNITY)
Admission: EM | Admit: 2020-12-20 | Discharge: 2021-01-16 | Disposition: E | Payer: PPO | Attending: Emergency Medicine | Admitting: Emergency Medicine

## 2020-12-20 DIAGNOSIS — G309 Alzheimer's disease, unspecified: Secondary | ICD-10-CM | POA: Diagnosis not present

## 2020-12-20 DIAGNOSIS — Z87891 Personal history of nicotine dependence: Secondary | ICD-10-CM | POA: Insufficient documentation

## 2020-12-20 DIAGNOSIS — I469 Cardiac arrest, cause unspecified: Secondary | ICD-10-CM

## 2020-12-20 DIAGNOSIS — R0603 Acute respiratory distress: Secondary | ICD-10-CM | POA: Insufficient documentation

## 2020-12-20 DIAGNOSIS — Z79899 Other long term (current) drug therapy: Secondary | ICD-10-CM | POA: Diagnosis not present

## 2020-12-20 DIAGNOSIS — R0902 Hypoxemia: Secondary | ICD-10-CM | POA: Diagnosis not present

## 2020-12-20 DIAGNOSIS — Z7984 Long term (current) use of oral hypoglycemic drugs: Secondary | ICD-10-CM | POA: Diagnosis not present

## 2020-12-20 DIAGNOSIS — Z4682 Encounter for fitting and adjustment of non-vascular catheter: Secondary | ICD-10-CM | POA: Diagnosis not present

## 2020-12-20 DIAGNOSIS — F0281 Dementia in other diseases classified elsewhere with behavioral disturbance: Secondary | ICD-10-CM | POA: Diagnosis not present

## 2020-12-20 DIAGNOSIS — R404 Transient alteration of awareness: Secondary | ICD-10-CM | POA: Diagnosis not present

## 2020-12-20 DIAGNOSIS — R0689 Other abnormalities of breathing: Secondary | ICD-10-CM | POA: Diagnosis not present

## 2020-12-20 DIAGNOSIS — E119 Type 2 diabetes mellitus without complications: Secondary | ICD-10-CM | POA: Insufficient documentation

## 2020-12-20 DIAGNOSIS — Z7982 Long term (current) use of aspirin: Secondary | ICD-10-CM | POA: Insufficient documentation

## 2020-12-20 DIAGNOSIS — R000 Tachycardia, unspecified: Secondary | ICD-10-CM | POA: Diagnosis not present

## 2020-12-20 DIAGNOSIS — I959 Hypotension, unspecified: Secondary | ICD-10-CM | POA: Diagnosis not present

## 2020-12-20 LAB — I-STAT VENOUS BLOOD GAS, ED
Acid-base deficit: 16 mmol/L — ABNORMAL HIGH (ref 0.0–2.0)
Bicarbonate: 18.3 mmol/L — ABNORMAL LOW (ref 20.0–28.0)
Calcium, Ion: 1.14 mmol/L — ABNORMAL LOW (ref 1.15–1.40)
HCT: 38 % (ref 36.0–46.0)
Hemoglobin: 12.9 g/dL (ref 12.0–15.0)
O2 Saturation: 97 %
Potassium: 3.5 mmol/L (ref 3.5–5.1)
Sodium: 145 mmol/L (ref 135–145)
TCO2: 21 mmol/L — ABNORMAL LOW (ref 22–32)
pCO2, Ven: 88.9 mmHg (ref 44.0–60.0)
pH, Ven: 6.922 — CL (ref 7.250–7.430)
pO2, Ven: 150 mmHg — ABNORMAL HIGH (ref 32.0–45.0)

## 2020-12-20 LAB — CBC WITH DIFFERENTIAL/PLATELET
Abs Immature Granulocytes: 0.66 10*3/uL — ABNORMAL HIGH (ref 0.00–0.07)
Basophils Absolute: 0.1 10*3/uL (ref 0.0–0.1)
Basophils Relative: 1 %
Eosinophils Absolute: 0.3 10*3/uL (ref 0.0–0.5)
Eosinophils Relative: 2 %
HCT: 43.7 % (ref 36.0–46.0)
Hemoglobin: 12.8 g/dL (ref 12.0–15.0)
Immature Granulocytes: 5 %
Lymphocytes Relative: 55 %
Lymphs Abs: 7.3 10*3/uL — ABNORMAL HIGH (ref 0.7–4.0)
MCH: 30 pg (ref 26.0–34.0)
MCHC: 29.3 g/dL — ABNORMAL LOW (ref 30.0–36.0)
MCV: 102.3 fL — ABNORMAL HIGH (ref 80.0–100.0)
Monocytes Absolute: 0.4 10*3/uL (ref 0.1–1.0)
Monocytes Relative: 3 %
Neutro Abs: 4.4 10*3/uL (ref 1.7–7.7)
Neutrophils Relative %: 34 %
Platelets: 118 10*3/uL — ABNORMAL LOW (ref 150–400)
RBC: 4.27 MIL/uL (ref 3.87–5.11)
RDW: 13.9 % (ref 11.5–15.5)
Smear Review: NORMAL
WBC: 13.1 10*3/uL — ABNORMAL HIGH (ref 4.0–10.5)
nRBC: 0.5 % — ABNORMAL HIGH (ref 0.0–0.2)

## 2020-12-20 LAB — I-STAT CHEM 8, ED
BUN: 37 mg/dL — ABNORMAL HIGH (ref 8–23)
Calcium, Ion: 1.16 mmol/L (ref 1.15–1.40)
Chloride: 112 mmol/L — ABNORMAL HIGH (ref 98–111)
Creatinine, Ser: 1.4 mg/dL — ABNORMAL HIGH (ref 0.44–1.00)
Glucose, Bld: 357 mg/dL — ABNORMAL HIGH (ref 70–99)
HCT: 38 % (ref 36.0–46.0)
Hemoglobin: 12.9 g/dL (ref 12.0–15.0)
Potassium: 3.5 mmol/L (ref 3.5–5.1)
Sodium: 145 mmol/L (ref 135–145)
TCO2: 21 mmol/L — ABNORMAL LOW (ref 22–32)

## 2020-12-20 LAB — BASIC METABOLIC PANEL
Anion gap: 15 (ref 5–15)
BUN: 29 mg/dL — ABNORMAL HIGH (ref 8–23)
CO2: 17 mmol/L — ABNORMAL LOW (ref 22–32)
Calcium: 8.3 mg/dL — ABNORMAL LOW (ref 8.9–10.3)
Chloride: 112 mmol/L — ABNORMAL HIGH (ref 98–111)
Creatinine, Ser: 1.67 mg/dL — ABNORMAL HIGH (ref 0.44–1.00)
GFR, Estimated: 30 mL/min — ABNORMAL LOW (ref >60)
Glucose, Bld: 376 mg/dL — ABNORMAL HIGH (ref 70–99)
Potassium: 3.5 mmol/L (ref 3.5–5.1)
Sodium: 144 mmol/L (ref 135–145)

## 2020-12-20 LAB — HEPATIC FUNCTION PANEL
ALT: 198 U/L — ABNORMAL HIGH (ref 0–44)
AST: 272 U/L — ABNORMAL HIGH (ref 15–41)
Albumin: 2.9 g/dL — ABNORMAL LOW (ref 3.5–5.0)
Alkaline Phosphatase: 92 U/L (ref 38–126)
Bilirubin, Direct: 0.2 mg/dL (ref 0.0–0.2)
Indirect Bilirubin: 0.4 mg/dL (ref 0.3–0.9)
Total Bilirubin: 0.6 mg/dL (ref 0.3–1.2)
Total Protein: 5 g/dL — ABNORMAL LOW (ref 6.5–8.1)

## 2020-12-20 LAB — TROPONIN I (HIGH SENSITIVITY): Troponin I (High Sensitivity): 247 ng/L (ref <18)

## 2020-12-20 LAB — MAGNESIUM: Magnesium: 2.5 mg/dL — ABNORMAL HIGH (ref 1.7–2.4)

## 2020-12-20 LAB — PROTIME-INR
INR: 1.7 — ABNORMAL HIGH (ref 0.8–1.2)
Prothrombin Time: 19.9 seconds — ABNORMAL HIGH (ref 11.4–15.2)

## 2020-12-20 LAB — BRAIN NATRIURETIC PEPTIDE: B Natriuretic Peptide: 64.1 pg/mL (ref 0.0–100.0)

## 2020-12-20 MED ORDER — EPINEPHRINE 1 MG/10ML IJ SOSY
PREFILLED_SYRINGE | INTRAMUSCULAR | Status: AC | PRN
  Administered 2020-12-20: 1 mg via INTRAVENOUS

## 2020-12-20 MED ORDER — MORPHINE SULFATE (PF) 2 MG/ML IV SOLN
2.0000 mg | INTRAVENOUS | Status: DC | PRN
  Administered 2020-12-20: 2 mg via INTRAVENOUS
  Filled 2020-12-20: qty 1

## 2020-12-20 MED ORDER — LORAZEPAM 2 MG/ML IJ SOLN
1.0000 mg | Freq: Four times a day (QID) | INTRAMUSCULAR | Status: DC | PRN
  Administered 2020-12-20: 1 mg via INTRAVENOUS
  Filled 2020-12-20: qty 1

## 2020-12-20 MED ORDER — LACTATED RINGERS IV BOLUS: 1000.0000 mL | Freq: Once | INTRAVENOUS | Status: DC

## 2020-12-21 LAB — PATHOLOGIST SMEAR REVIEW

## 2021-01-16 NOTE — ED Triage Notes (Signed)
Patient BIB [facility] after witnessed arrest. Patient found by son with agonal breathing, son performed CPR for 15 minutes. EMS arrived, continued CPR for 10 minutes and gave 2x epi, ROSC. En route to [facility] CPR resumed for asystole, performed for 10 minutes, 2x epi, ROSC.

## 2021-01-16 NOTE — Progress Notes (Signed)
Patient was compassionately extubated at 1539 per MD order with family at bedside.

## 2021-01-16 NOTE — ED Provider Notes (Signed)
Brief update note  See Dr. Gwenette Greet note for full EDP documentation.  86 year old lady presenting to ER postarrest.  Called out for respiratory distress, found to have agonal breathing, received at least 15 minutes of CPR prior to EMS arrival, EMS gave additional 20 minutes of CPR.  Asystole.  ROSC was achieved prior to arrival in ER.  On arrival here, patient sinus tach with soft BP. GCS3, no pupil response, no spontaneous breaths. Soon after arrival to ER patient briefly coded again, intubated.  She was started on Levophed drip.  Lengthy discussion with grandson Miaisabella Bacorn who reports that he is medical power of attorney.  Discussed concern for very grim prognosis given prolonged PEA arrest, repeat code events.  Given current situation, he states that they would like to pursue comfort measures only at this time.  He is driving from [facility] to hospital at this time.  Discussed with staff.  We will hold further pressor support.  Placed order for DNR.  Suspect patient will pass shortly.  If patient is still alive when family arrives, anticipate palliative extubation in ER.    Lucrezia Starch, MD 2021-01-16 [phone]

## 2021-01-16 NOTE — ED Provider Notes (Signed)
  Physical Exam  BP (!) 0/0   Pulse (!) 0   Temp (!) 96.9 F (36.1 C) (Temporal)   Resp (!) 0   Ht 5\' 3"  (1.6 m)   Wt 77.1 kg   SpO2 (!) 84%   BMI 30.11 kg/m   Physical Exam  ED Course/Procedures     Procedures  MDM  Upon reevaluation, patient made no respiratory effort.  Was apneic for over a minute.  Cardiac auscultation revealed no heart sounds.  Had no corneal reflexes or gag reflex.  Patient was declared dead at 38.  Family updated at bedside.  No need for medical examiner referral due to patient's age and comorbidities.  Family not interested in medical examiner.  PCP's office paged to fill out death certificate, they will sign death certificate.     Suzan Nailer, DO Dec 27, 2020 2135    Tegeler, Gwenyth Allegra, MD 12/21/20 2009

## 2021-01-16 NOTE — ED Provider Notes (Signed)
[place] EMERGENCY DEPARTMENT Provider Note   CSN: [id number] Arrival date & time: 24-Dec-2020  1327     History No chief complaint on file.   Nichole Diaz is a 86 y.o. female.  HPI  Presents s/p CPR Son found at home with significant respiratory difficulty Cardiac arrest prior to EMS arrival, got bystander CPR PEA on EMS arrival; king airway placed, estimate total 44m CPR with 2 rounds of epi before ROSC Achieved ROSC for 44m, no recovery in mental status, occasionally spontaneous respiratory effort 700ccNS en route with EMS Coded again close to time of arrival; 59m CPR, 2 more rounds epi ROSC on arrival to ambulance bay I/O access present [facility] airway in place Only meds on paper with EMS are psych and dementia meds     Past Medical History:  Diagnosis Date  . Alzheimer's dementia with behavioral disturbance ([place])   . Diabetes mellitus ([place])   . GERD (gastroesophageal reflux disease)   . Hyperlipidemia   . Overactive bladder   . Rotator cuff tear 2003    Patient Active Problem List   Diagnosis Date Noted  . Dementia with behavioral disturbance ([place]) 05/24/2018  . Alzheimer's dementia, late onset, with behavioral disturbance ([place]) 10/06/2017    Past Surgical History:  Procedure Laterality Date  . COLONOSCOPY  12/11/2011  . DIAGNOSTIC MAMMOGRAM  06/02/2014     OB History   No obstetric history on file.     Family History  Problem Relation Age of Onset  . CVA Mother 19  . Heart attack Sister   . Heart failure Father   . Goiter Father   . Chronic Renal Failure Father   . Alzheimer's disease Sister     Social History   Tobacco Use  . Smoking status: Former Smoker    Quit date: 1988    Years since quitting: 34.3  . Smokeless tobacco: Never Used  . Tobacco comment: Quit at least 30 years ago  Vaping Use  . Vaping Use: Never used  Substance Use Topics  . Alcohol use: No  . Drug use: No    Home Medications Prior to Admission  medications   Medication Sig Start Date End Date Taking? Authorizing Provider  ALPRAZolam (XANAX) 0.25 MG tablet TAKE 1 TO 2 TABLETS(0.25 TO 0.5 MG) BY MOUTH THREE TIMES DAILY AS NEEDED FOR ANXIETY 06/18/20   Melvenia Beam, MD  aspirin 81 MG tablet Take 81 mg by mouth daily.    [provider]  Cholecalciferol (VITAMIN D3) 5000 units CAPS Take 1 capsule by mouth daily.    [provider]  donepezil (ARICEPT) 10 MG tablet TAKE 1 TABLET(10 MG) BY MOUTH AT BEDTIME 05/17/20   Lomax, Amy, NP  JANUVIA 100 MG tablet Take 100 mg by mouth daily.  03/09/17   [provider]  Omega-3 Fatty Acids (FISH OIL PO) Take 300 mg by mouth daily.    [provider]  QUEtiapine (SEROQUEL) 100 MG tablet TAKE 1 TABLET(100 MG) BY MOUTH TWICE DAILY 10/17/20   Lomax, Amy, NP  sertraline (ZOLOFT) 50 MG tablet TAKE 1 TABLET(50 MG) BY MOUTH DAILY 05/01/20   Melvenia Beam, MD    Allergies    Patient has no known allergies.  Review of Systems   Review of Systems  Unable to perform ROS: Mental status change    Physical Exam Updated Vital Signs BP (!) 88/39   Pulse 84   Temp (!) 96.9 F (36.1 C) (Temporal)  Resp (!) 26   SpO2 97%   Physical Exam Constitutional:      Comments: Unresponsive No response to painful stimuli Occasional spontaneous respiration  HENT:     Head: Normocephalic and atraumatic.  Eyes:     Comments: 2mm fixed  Neck:     Comments: No C-spine stepoff Trachea midline Cardiovascular:     Rate and Rhythm: Normal rate.     Comments: Variable strength of peripheral and central pulses on palpation. Regular rate. Pulmonary:     Comments: Breath sounds bilaterally and are equal without wheezing Abdominal:     General: There is no distension.     Comments: obese  Musculoskeletal:        General: No deformity.     Right lower leg: No edema.     Left lower leg: No edema.  Skin:    General: Skin is dry.     Findings: Bruising (at site of LUCAS)  present.  Neurological:     Comments: No pupillary reaction Occasional spontaneous respiration No response to pain     ED Results / Procedures / Treatments   Labs (all labs ordered are listed, but only abnormal results are displayed) Labs Reviewed  CBC WITH DIFFERENTIAL/PLATELET - Abnormal; Notable for the following components:      Result Value   WBC 13.1 (*)    MCV 102.3 (*)    MCHC 29.3 (*)    Platelets 118 (*)    nRBC 0.5 (*)    Lymphs Abs 7.3 (*)    Abs Immature Granulocytes 0.66 (*)    All other components within normal limits  BASIC METABOLIC PANEL - Abnormal; Notable for the following components:   Chloride 112 (*)    CO2 17 (*)    Glucose, Bld 376 (*)    BUN 29 (*)    Creatinine, Ser 1.67 (*)    Calcium 8.3 (*)    GFR, Estimated 30 (*)    All other components within normal limits  HEPATIC FUNCTION PANEL - Abnormal; Notable for the following components:   Total Protein 5.0 (*)    Albumin 2.9 (*)    AST 272 (*)    ALT 198 (*)    All other components within normal limits  PROTIME-INR - Abnormal; Notable for the following components:   Prothrombin Time 19.9 (*)    INR 1.7 (*)    All other components within normal limits  MAGNESIUM - Abnormal; Notable for the following components:   Magnesium 2.5 (*)    All other components within normal limits  I-STAT CHEM 8, ED - Abnormal; Notable for the following components:   Chloride 112 (*)    BUN 37 (*)    Creatinine, Ser 1.40 (*)    Glucose, Bld 357 (*)    TCO2 21 (*)    All other components within normal limits  I-STAT VENOUS BLOOD GAS, ED - Abnormal; Notable for the following components:   pH, Ven 6.922 (*)    pCO2, Ven 88.9 (*)    pO2, Ven 150.0 (*)    Bicarbonate 18.3 (*)    TCO2 21 (*)    Acid-base deficit 16.0 (*)    Calcium, Ion 1.14 (*)    All other components within normal limits  TROPONIN I (HIGH SENSITIVITY) - Abnormal; Notable for the following components:   Troponin I (High Sensitivity) 247 (*)     All other components within normal limits  RESP PANEL BY RT-PCR (FLU A&B, COVID) ARPGX2  BRAIN NATRIURETIC PEPTIDE  BLOOD GAS, VENOUS  LACTIC ACID, PLASMA  LACTIC ACID, PLASMA  PATHOLOGIST SMEAR REVIEW  TYPE AND SCREEN  TROPONIN I (HIGH SENSITIVITY)    EKG None  Radiology DG Chest Portable 1 View  Result Date: 12-22-20 CLINICAL DATA:  ETT placement.  Post arrest. EXAM: PORTABLE CHEST 1 VIEW COMPARISON:  September 15, 2009. FINDINGS: Endotracheal tube terminating in the mid trachea approximately 4 cm from the carina. Gastric tube courses below the level of the diaphragm, side port below expected location of GE junction. Pacer device pads project over the central chest. EKG leads project over the chest. Cardiomediastinal contours grossly stable and normal accounting for portable technique and rotation on the current study. Lungs are clear with low lung volumes. Visualized skeletal structures without acute process on limited assessment. IMPRESSION: Endotracheal tube and gastric tube as described. No acute cardiopulmonary disease on limited assessment. Electronically Signed   By: Zetta Bills M.D.   On: 12-22-20 14:21    Procedures Procedure Name: Intubation Date/Time: 12/22/20 2:20 PM Performed by: Aris Lot, MD Pre-anesthesia Checklist: Patient identified and Emergency Drugs available Oxygen Delivery Method: Ambu bag Preoxygenation: Pre-oxygenation with 100% oxygen Ventilation: Oral airway inserted - appropriate to patient size Laryngoscope Size: Glidescope Grade View: Grade I Tube size: 7.5 mm Number of attempts: 1 Airway Equipment and Method: Rigid stylet and Video-laryngoscopy Placement Confirmation: Positive ETCO2 and Breath sounds checked- equal and bilateral Secured at: 23 cm Tube secured with: ETT holder Dental Injury: Teeth and Oropharynx as per pre-operative assessment      .Critical Care Performed by: Aris Lot, MD Authorized by: Lucrezia Starch, MD   Critical care provider statement:    Critical care time (minutes):  30   Critical care start time:  12-22-20 1:29 PM   Critical care end time:  2020-12-22 1:59 PM   Critical care was necessary to treat or prevent imminent or life-threatening deterioration of the following conditions:  Cardiac failure, respiratory failure, shock and circulatory failure   Critical care was time spent personally by me on the following activities:  Obtaining history from patient or surrogate, re-evaluation of patient's condition, pulse oximetry and ordering and performing treatments and interventions   I assumed direction of critical care for this patient from another provider in my specialty: no      Medications Ordered in ED Medications  lactated ringers bolus 1,000 mL (has no administration in time range)  LORazepam (ATIVAN) injection 1 mg (1 mg Intravenous Given 22-Dec-2020 1517)  morphine 2 MG/ML injection 2 mg (2 mg Intravenous Given Dec 22, 2020 1517)  EPINEPHrine (ADRENALIN) 1 MG/10ML injection (1 mg Intravenous Given 12/22/20 1327)  EPINEPHrine (ADRENALIN) 1 MG/10ML injection (1 mg Intravenous Given 12-22-2020 1340)    ED Course  I have reviewed the triage vital signs and the nursing notes.  Pertinent labs & imaging results that were available during my care of the patient were reviewed by me and considered in my medical decision making (see chart for details).    MDM Rules/Calculators/A&P                           Presents as post-arrest Etiology unclear; with report of respiratory distress, consider PE, acute CHF, MI; notably hypoxic despite good ventilation ECG without acute focal ischemic change or ST elevation, no arrhythmia, sinus rhythm No signs/sx DVT on exam Minimal bloody output into OG but no evidence of frank GI bleed Neurologic status poor; intubated without  RSI meds required Requiring two pressors to maintain blood pressure Do not think blood or lytics are either emergently  indicated My attending on record d/w family who recommended DNR and no escalation of care. Family on the way to come here to visit. On family arrival, they elect for  Comfort care. Palliative extubation ordered. Family in agreement. Not expired at end of my shift. Care handed off to Dr. Miki Kins at 1500.  Final Clinical Impression(s) / ED Diagnoses Final diagnoses:  None    Rx / DC Orders ED Discharge Orders    None       Aris Lot, MD Dec 30, 2020 1619    Lucrezia Starch, MD 12/21/20 2018

## 2021-01-16 NOTE — ED Provider Notes (Signed)
3:24 PM Care assumed with Dr. Roslynn Amble.  At time of transfer care, patient is going to be extubated compassionately and care will be withdrawn per family request.  If patient does not expire in a short period of time, anticipate admission to medicine for end-of-life care.  Patient expired at 1631.  Patient had just cleared by Dr. Miki Kins.  Emergency team spoke to Dr. Osborne Casco, the patient's PCP who agrees to the certificate.  Family was at the bedside during expiration.  Patient was taken to the morgue as she is not a [employer] case.   Clinical Impression: 1. Cardiac arrest [facility])     Disposition: expired  This note was prepared with assistance of Dragon voice recognition software. Occasional wrong-word or sound-a-like substitutions may have occurred due to the inherent limitations of voice recognition software.      Selam Pietsch, Gwenyth Allegra, MD 01/08/21 [phone]

## 2021-01-16 DEATH — deceased

## 2022-03-03 IMAGING — DX DG CHEST 1V PORT
1 series · 1 of 1 positions shown · non-contrast
Comparison: September 15, 2009.

CLINICAL DATA: ETT placement.  Post arrest.

EXAM:
PORTABLE CHEST 1 VIEW

[chest]
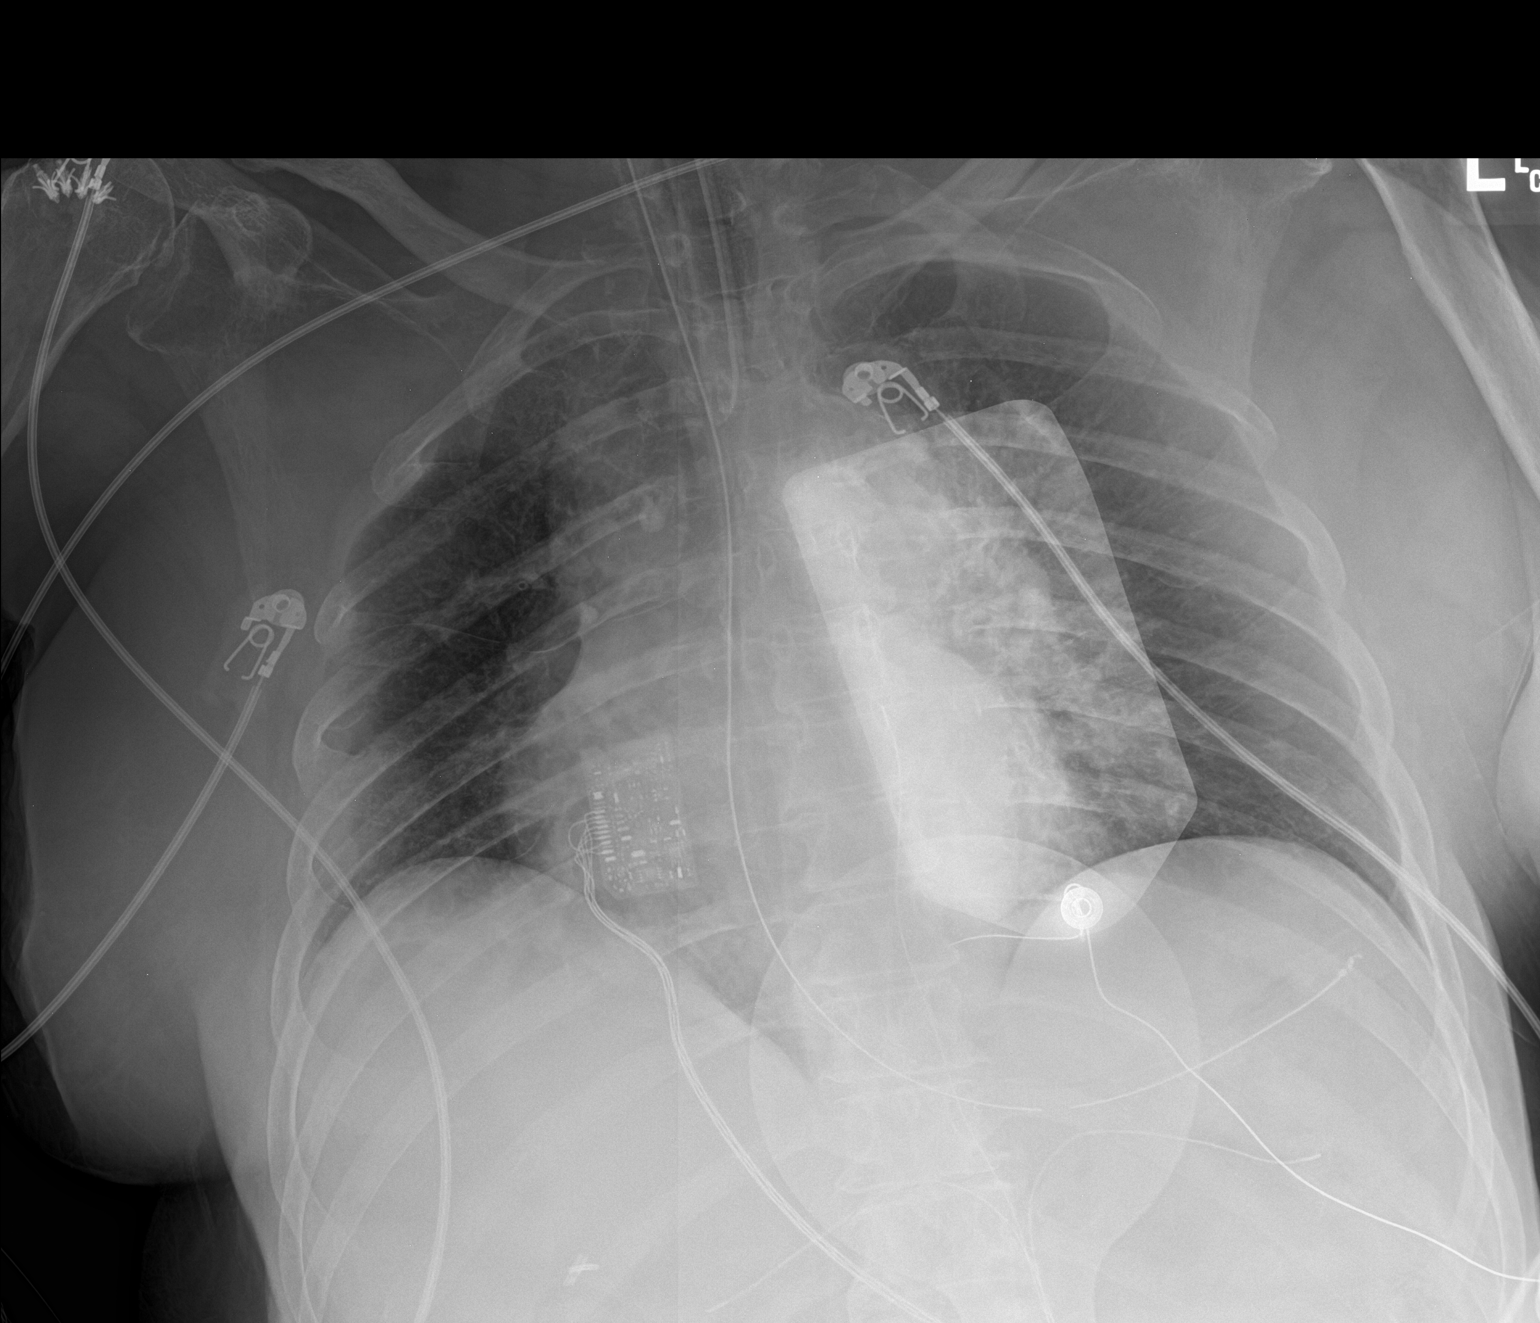

[1 of 1 positions shown; findings below may reference images not displayed]

FINDINGS: Endotracheal tube terminating in the mid trachea approximately 4 cm
from the carina.

Gastric tube courses below the level of the diaphragm, side port
below expected location of GE junction. Pacer device pads project
over the central chest.

EKG leads project over the chest.

Cardiomediastinal contours grossly stable and normal accounting for
portable technique and rotation on the current study.

Lungs are clear with low lung volumes.

Visualized skeletal structures without acute process on limited
assessment.
IMPRESSION: Endotracheal tube and gastric tube as described. No acute
cardiopulmonary disease on limited assessment.
